# Patient Record
Sex: Female | Born: 1995 | Race: White | Hispanic: No | Marital: Married | Smoking: Former smoker
Health system: Southern US, Community
[De-identification: ages and names within clinical notes are randomized; demographics above are authoritative.]

## PROBLEM LIST (undated history)

## (undated) ENCOUNTER — Inpatient Hospital Stay (HOSPITAL_COMMUNITY): Payer: Self-pay

## (undated) DIAGNOSIS — T4145XA Adverse effect of unspecified anesthetic, initial encounter: Secondary | ICD-10-CM

## (undated) DIAGNOSIS — T8859XA Other complications of anesthesia, initial encounter: Secondary | ICD-10-CM

## (undated) DIAGNOSIS — Z789 Other specified health status: Secondary | ICD-10-CM

## (undated) HISTORY — PX: WISDOM TOOTH EXTRACTION: SHX21

---

## 2017-08-24 NOTE — L&D Delivery Note (Signed)
Delivery Note At 12:34 PM a viable female was delivered via Vaginal, Spontaneous OA   APGAR: 9,9  weight  pending .   Placenta status:spontaneously with 3 vessel cord , .  Cord:  with the following complications: none.  Cord pH: not obtained Anesthesia:  Epidural Episiotomy: None Lacerations:  2nd Suture Repair: 3.0 chromic Est. Blood Loss (mL):  300  Mom to postpartum.  Baby to Couplet care / Skin to Skin.  Nilam Quakenbush L 07/10/2018, 12:48 PM

## 2017-09-26 DIAGNOSIS — Z5321 Procedure and treatment not carried out due to patient leaving prior to being seen by health care provider: Secondary | ICD-10-CM | POA: Insufficient documentation

## 2017-09-26 DIAGNOSIS — M79604 Pain in right leg: Secondary | ICD-10-CM | POA: Insufficient documentation

## 2017-09-27 ENCOUNTER — Emergency Department (HOSPITAL_COMMUNITY)
Admission: EM | Admit: 2017-09-27 | Discharge: 2017-09-27 | Disposition: A | Discharge status: Home / Self Care | Payer: Self-pay | Attending: Emergency Medicine | Admitting: Emergency Medicine

## 2017-09-27 ENCOUNTER — Encounter (HOSPITAL_COMMUNITY): Payer: Self-pay

## 2017-09-27 ENCOUNTER — Other Ambulatory Visit: Payer: Self-pay

## 2017-09-27 NOTE — ED Notes (Signed)
Pt states they are not waiting

## 2017-09-27 NOTE — ED Triage Notes (Addendum)
Pt reports R upper leg pain that shoots up in to her hip and side. She denies any urinary issues. A&Ox4. Ambulatory. When asked about a pregnancy test, she states, "I don't really need one, although she mentioned it to registration."

## 2017-12-24 LAB — OB RESULTS CONSOLE HEPATITIS B SURFACE ANTIGEN: HEP B S AG: NEGATIVE

## 2017-12-24 LAB — OB RESULTS CONSOLE RUBELLA ANTIBODY, IGM: RUBELLA: IMMUNE

## 2017-12-24 LAB — OB RESULTS CONSOLE HIV ANTIBODY (ROUTINE TESTING): HIV: NONREACTIVE

## 2017-12-24 LAB — OB RESULTS CONSOLE GC/CHLAMYDIA
CHLAMYDIA, DNA PROBE: NEGATIVE
GC PROBE AMP, GENITAL: NEGATIVE

## 2017-12-24 LAB — OB RESULTS CONSOLE RPR: RPR: NONREACTIVE

## 2018-04-10 ENCOUNTER — Inpatient Hospital Stay (HOSPITAL_COMMUNITY)
Admission: AD | Admit: 2018-04-10 | Discharge: 2018-04-11 | Disposition: A | Discharge status: Home / Self Care | Payer: BLUE CROSS/BLUE SHIELD | Source: Ambulatory Visit | Attending: Obstetrics and Gynecology | Admitting: Obstetrics and Gynecology

## 2018-04-10 ENCOUNTER — Encounter (HOSPITAL_COMMUNITY): Payer: Self-pay

## 2018-04-10 DIAGNOSIS — R319 Hematuria, unspecified: Secondary | ICD-10-CM | POA: Diagnosis not present

## 2018-04-10 DIAGNOSIS — R3129 Other microscopic hematuria: Secondary | ICD-10-CM

## 2018-04-10 DIAGNOSIS — Z3A24 24 weeks gestation of pregnancy: Secondary | ICD-10-CM | POA: Diagnosis not present

## 2018-04-10 DIAGNOSIS — Z87891 Personal history of nicotine dependence: Secondary | ICD-10-CM | POA: Diagnosis not present

## 2018-04-10 DIAGNOSIS — O26892 Other specified pregnancy related conditions, second trimester: Secondary | ICD-10-CM | POA: Insufficient documentation

## 2018-04-10 DIAGNOSIS — R10A1 Flank pain, right side: Secondary | ICD-10-CM | POA: Diagnosis present

## 2018-04-10 DIAGNOSIS — R109 Unspecified abdominal pain: Secondary | ICD-10-CM | POA: Insufficient documentation

## 2018-04-10 NOTE — MAU Note (Addendum)
Right sided pain in her abdomen that radiates around to her back that started at 2000.  States it's constant but intermittently gets worse.  No VB/LOF.  +FM.  Has not taken anything for the pain.  No hx of any kidney issues-does get frequent UTIs.

## 2018-04-11 ENCOUNTER — Encounter (HOSPITAL_COMMUNITY): Payer: Self-pay

## 2018-04-11 ENCOUNTER — Inpatient Hospital Stay (HOSPITAL_COMMUNITY): Payer: BLUE CROSS/BLUE SHIELD

## 2018-04-11 DIAGNOSIS — Z3A24 24 weeks gestation of pregnancy: Secondary | ICD-10-CM

## 2018-04-11 DIAGNOSIS — O26892 Other specified pregnancy related conditions, second trimester: Secondary | ICD-10-CM

## 2018-04-11 DIAGNOSIS — R109 Unspecified abdominal pain: Secondary | ICD-10-CM

## 2018-04-11 DIAGNOSIS — R319 Hematuria, unspecified: Secondary | ICD-10-CM | POA: Diagnosis present

## 2018-04-11 DIAGNOSIS — R10A1 Flank pain, right side: Secondary | ICD-10-CM | POA: Diagnosis present

## 2018-04-11 LAB — URINALYSIS, ROUTINE W REFLEX MICROSCOPIC
BILIRUBIN URINE: NEGATIVE
GLUCOSE, UA: NEGATIVE mg/dL
Ketones, ur: NEGATIVE mg/dL
NITRITE: NEGATIVE
PROTEIN: 100 mg/dL — AB
Specific Gravity, Urine: 1.009 (ref 1.005–1.030)
WBC, UA: >50 WBC/hpf — ABNORMAL HIGH (ref 0–5)
pH: 7 (ref 5.0–8.0)

## 2018-04-11 MED ORDER — CYCLOBENZAPRINE HCL 10 MG PO TABS
10.0000 mg | ORAL_TABLET | Freq: Once | ORAL | Status: AC
  Administered 2018-04-11: 10 mg via ORAL
  Filled 2018-04-11: qty 2

## 2018-04-11 MED ORDER — OXYCODONE-ACETAMINOPHEN 5-325 MG PO TABS: 1.0000 | ORAL_TABLET | Freq: Four times a day (QID) | ORAL | 0 refills | Status: DC | PRN

## 2018-04-11 MED ORDER — OXYCODONE-ACETAMINOPHEN 5-325 MG PO TABS
1.0000 | ORAL_TABLET | Freq: Once | ORAL | Status: AC
  Administered 2018-04-11: 1 via ORAL
  Filled 2018-04-11: qty 1

## 2018-04-11 MED ORDER — TAMSULOSIN HCL 0.4 MG PO CAPS: 0.8000 mg | ORAL_CAPSULE | Freq: Every day | ORAL | 0 refills | Status: DC

## 2018-04-11 NOTE — Progress Notes (Addendum)
Disposition of admit was entered in error.  Raelyn Moraolitta Sharnise Blough, CNM

## 2018-04-11 NOTE — MAU Provider Note (Signed)
History     CSN: [id number]  Arrival date and time: 04/10/18 2321   First Provider Initiated Contact with Patient 04/10/18 2359      Chief Complaint  Patient presents with  . Abdominal Pain   HPI  Ms.  Kaylee Watson is a 22 y.o. year old 432P0010 female at 3077w3d weeks gestation who presents to MAU reporting RT sided flank pain that radiates to her abdomen and lower back since 2000 while sitting on the couch. She reports the pain is constant, but getting worse off and on. She denies UC's, VB or LOF. She hasn't taken anything for the pain. No h/o kidney problems in the past.   History reviewed. No pertinent past medical history.  History reviewed. No pertinent surgical history.  No family history on file.  Social History   Tobacco Use  . Smoking status: Former Games developermoker  . Smokeless tobacco: Never Used  Substance Use Topics  . Alcohol use: Not on file  . Drug use: Never    Allergies: No Known Allergies  Medications Prior to Admission  Medication Sig Dispense Refill Last Dose  . Prenatal Vit-Fe Fumarate-FA (PRENATAL MULTIVITAMIN) TABS tablet Take 1 tablet by mouth daily at 12 noon.       Review of Systems  Constitutional: Negative.   HENT: Negative.   Eyes: Negative.   Respiratory: Negative.   Cardiovascular: Negative.   Gastrointestinal: Positive for abdominal pain.  Endocrine: Negative.   Genitourinary: Positive for flank pain. Negative for dysuria, frequency, hematuria and urgency.  Musculoskeletal: Positive for back pain.  Skin: Negative.   Allergic/Immunologic: Negative.   Neurological: Negative.   Hematological: Negative.   Psychiatric/Behavioral: Negative.    Physical Exam   Blood pressure 126/60, pulse 98, temperature 97.8 F (36.6 C), resp. rate 17, height 5\' 6"  (1.676 m), weight 87.8 kg, SpO2 99 %.  Physical Exam  Nursing note and vitals reviewed. Constitutional: She is oriented to person, place, and time. She appears well-developed and  well-nourished.  HENT:  Head: Normocephalic and atraumatic.  Eyes: Pupils are equal, round, and reactive to light.  Neck: Normal range of motion.  Cardiovascular: Normal rate.  Respiratory: Effort normal.  GI: Soft. There is no CVA tenderness.  Genitourinary:  Genitourinary Comments: Pelvic deferred  Musculoskeletal: Normal range of motion.  Neurological: She is alert and oriented to person, place, and time. She has normal reflexes.  Skin: Skin is warm and dry.  Psychiatric: She has a normal mood and affect. Her behavior is normal. Judgment and thought content normal.    MAU Course  Procedures  MDM CCUA NST - FHR: 130 bpm / moderate variability / accels present / decels absent / TOCO: none Renal U/S  Results for orders placed or performed during the hospital encounter of 04/10/18 (from the past 24 hour(s))  Urinalysis, Routine w reflex microscopic     Status: Abnormal   Collection Time: 04/10/18 11:57 PM  Result Value Ref Range   Color, Urine YELLOW YELLOW   APPearance CLOUDY (A) CLEAR   Specific Gravity, Urine 1.009 1.005 - 1.030   pH 7.0 5.0 - 8.0   Glucose, UA NEGATIVE NEGATIVE mg/dL   Hgb urine dipstick MODERATE (A) NEGATIVE   Bilirubin Urine NEGATIVE NEGATIVE   Ketones, ur NEGATIVE NEGATIVE mg/dL   Protein, ur 409100 (A) NEGATIVE mg/dL   Nitrite NEGATIVE NEGATIVE   Leukocytes, UA LARGE (A) NEGATIVE   RBC / HPF >50 (H) 0 - 5 RBC/hpf   WBC, UA >50 (H) 0 -  5 WBC/hpf   Bacteria, UA RARE (A) NONE SEEN   Non Squamous Epithelial 6-10 (A) NONE SEEN    [place] Renal  Result Date: 04/11/2018 CLINICAL DATA:  Acute right flank pain with hematuria, [redacted] weeks pregnant EXAM: RENAL / URINARY TRACT ULTRASOUND COMPLETE COMPARISON:  None. FINDINGS: Right Kidney: Length: 12.2 cm.  Mild right hydronephrosis.  No focal abnormality. Left Kidney: Length: 11.6 cm. Echogenicity within normal limits. No mass or hydronephrosis visualized. Bladder: Appears normal for degree of bladder distention.  IMPRESSION: Mild right hydronephrosis. Electronically Signed   By: Jasmine PangKim  Fujinaga M.D.   On: 04/11/2018 02:30    *Consult with Dr. Elon SpannerLeger @ 0100 - notified of patient's complaints, assessments, lab results, recommended tx plan Flexeril for back pain and renal U/S, if normal U/S d/c home with Rx for Flexeril and Flomax  Assessment and Plan  Blood urine  - Advised that she could possibly have a kidney stone and will be prescribed Flomax to help pass any possible kidney stone she might have - Rx for Flomax 0.8 mg daily after breakfast - Information provided on hematuria   Acute right flank pain  - Rx for Percocet 5/325 mg 1-2 tablets every 6 hrs prn pain sent - Information provided on flank pain   - Discharge patient - Call [facility] to see if they want to see you sooner than your scheduled OB appt on 04/20/18 - Patient verbalized an understanding of the plan of care and agrees.     Kaylee Moraolitta Adira Limburg, MSN, CNM 04/11/2018, 12:30 AM

## 2018-05-07 ENCOUNTER — Inpatient Hospital Stay (HOSPITAL_COMMUNITY)
Admission: AD | Admit: 2018-05-07 | Discharge: 2018-05-08 | Disposition: A | Discharge status: Home / Self Care | Payer: BLUE CROSS/BLUE SHIELD | Source: Ambulatory Visit | Attending: Obstetrics and Gynecology | Admitting: Obstetrics and Gynecology

## 2018-05-07 DIAGNOSIS — Z87891 Personal history of nicotine dependence: Secondary | ICD-10-CM | POA: Diagnosis not present

## 2018-05-07 DIAGNOSIS — O99613 Diseases of the digestive system complicating pregnancy, third trimester: Secondary | ICD-10-CM | POA: Diagnosis not present

## 2018-05-07 DIAGNOSIS — K529 Noninfective gastroenteritis and colitis, unspecified: Secondary | ICD-10-CM | POA: Insufficient documentation

## 2018-05-07 DIAGNOSIS — N390 Urinary tract infection, site not specified: Secondary | ICD-10-CM

## 2018-05-07 DIAGNOSIS — Z3A28 28 weeks gestation of pregnancy: Secondary | ICD-10-CM | POA: Diagnosis not present

## 2018-05-07 DIAGNOSIS — O2343 Unspecified infection of urinary tract in pregnancy, third trimester: Secondary | ICD-10-CM | POA: Insufficient documentation

## 2018-05-07 DIAGNOSIS — K219 Gastro-esophageal reflux disease without esophagitis: Secondary | ICD-10-CM | POA: Insufficient documentation

## 2018-05-07 DIAGNOSIS — O26893 Other specified pregnancy related conditions, third trimester: Secondary | ICD-10-CM | POA: Diagnosis not present

## 2018-05-07 DIAGNOSIS — R112 Nausea with vomiting, unspecified: Secondary | ICD-10-CM | POA: Diagnosis present

## 2018-05-07 HISTORY — DX: Other complications of anesthesia, initial encounter: T88.59XA

## 2018-05-07 HISTORY — DX: Other specified health status: Z78.9

## 2018-05-07 HISTORY — DX: Adverse effect of unspecified anesthetic, initial encounter: T41.45XA

## 2018-05-07 LAB — URINALYSIS, ROUTINE W REFLEX MICROSCOPIC
Bilirubin Urine: NEGATIVE
GLUCOSE, UA: NEGATIVE mg/dL
Hgb urine dipstick: NEGATIVE
Ketones, ur: 80 mg/dL — AB
NITRITE: NEGATIVE
Protein, ur: 30 mg/dL — AB
Specific Gravity, Urine: 1.018 (ref 1.005–1.030)
Trans Epithel, UA: <1
WBC, UA: >50 WBC/hpf — ABNORMAL HIGH (ref 0–5)
pH: 6 (ref 5.0–8.0)

## 2018-05-07 NOTE — MAU Note (Signed)
N/V/D all day. Step-daughter sick as well. Denies vag bleeding or d/c. Hx previa-u/s for this Weds to ck if resolved.

## 2018-05-08 ENCOUNTER — Encounter (HOSPITAL_COMMUNITY): Payer: Self-pay | Admitting: *Deleted

## 2018-05-08 ENCOUNTER — Other Ambulatory Visit: Payer: Self-pay

## 2018-05-08 DIAGNOSIS — K529 Noninfective gastroenteritis and colitis, unspecified: Secondary | ICD-10-CM

## 2018-05-08 DIAGNOSIS — Z3A28 28 weeks gestation of pregnancy: Secondary | ICD-10-CM

## 2018-05-08 DIAGNOSIS — O26893 Other specified pregnancy related conditions, third trimester: Secondary | ICD-10-CM

## 2018-05-08 DIAGNOSIS — O99613 Diseases of the digestive system complicating pregnancy, third trimester: Secondary | ICD-10-CM | POA: Diagnosis not present

## 2018-05-08 LAB — COMPREHENSIVE METABOLIC PANEL
ALT: 10 U/L (ref 0–44)
ANION GAP: 11 (ref 5–15)
AST: 17 U/L (ref 15–41)
Albumin: 3 g/dL — ABNORMAL LOW (ref 3.5–5.0)
Alkaline Phosphatase: 84 U/L (ref 38–126)
BUN: 8 mg/dL (ref 6–20)
CHLORIDE: 102 mmol/L (ref 98–111)
CO2: 23 mmol/L (ref 22–32)
CREATININE: 0.61 mg/dL (ref 0.44–1.00)
Calcium: 8.8 mg/dL — ABNORMAL LOW (ref 8.9–10.3)
Glucose, Bld: 125 mg/dL — ABNORMAL HIGH (ref 70–99)
POTASSIUM: 3.6 mmol/L (ref 3.5–5.1)
SODIUM: 136 mmol/L (ref 135–145)
Total Bilirubin: 0.2 mg/dL — ABNORMAL LOW (ref 0.3–1.2)
Total Protein: 6.2 g/dL — ABNORMAL LOW (ref 6.5–8.1)

## 2018-05-08 MED ORDER — DEXTROSE 5 % IN LACTATED RINGERS IV BOLUS
1000.0000 mL | Freq: Once | INTRAVENOUS | Status: AC
  Administered 2018-05-08: 1000 mL via INTRAVENOUS

## 2018-05-08 MED ORDER — ONDANSETRON 8 MG PO TBDP: 8.0000 mg | ORAL_TABLET | Freq: Three times a day (TID) | ORAL | 0 refills | Status: DC | PRN

## 2018-05-08 MED ORDER — SIMETHICONE 80 MG PO CHEW
160.0000 mg | CHEWABLE_TABLET | Freq: Once | ORAL | Status: AC
Start: 2018-05-08 — End: 2018-05-08
  Administered 2018-05-08: 160 mg via ORAL
  Filled 2018-05-08: qty 2

## 2018-05-08 MED ORDER — FAMOTIDINE IN NACL 20-0.9 MG/50ML-% IV SOLN
20.0000 mg | Freq: Once | INTRAVENOUS | Status: AC
  Administered 2018-05-08: 20 mg via INTRAVENOUS
  Filled 2018-05-08: qty 50

## 2018-05-08 MED ORDER — GI COCKTAIL ~~LOC~~
30.0000 mL | Freq: Once | ORAL | Status: AC
  Administered 2018-05-08: 30 mL via ORAL
  Filled 2018-05-08: qty 30

## 2018-05-08 MED ORDER — SODIUM CHLORIDE 0.9 % IV SOLN
8.0000 mg | Freq: Once | INTRAVENOUS | Status: AC
  Administered 2018-05-08: 8 mg via INTRAVENOUS
  Filled 2018-05-08: qty 4

## 2018-05-08 MED ORDER — M.V.I. ADULT IV INJ
Freq: Once | INTRAVENOUS | Status: AC
  Administered 2018-05-08: 02:00:00 via INTRAVENOUS
  Filled 2018-05-08: qty 1000

## 2018-05-08 MED ORDER — CEPHALEXIN 500 MG PO CAPS: 500.0000 mg | ORAL_CAPSULE | Freq: Four times a day (QID) | ORAL | 0 refills | Status: DC

## 2018-05-08 NOTE — MAU Provider Note (Signed)
History     CSN: [id number]  Arrival date and time: 05/07/18 2301   None     Chief Complaint  Patient presents with  . Emesis During Pregnancy  . Diarrhea   HPI   Ms.Kaylee Watson is a 22 y.o. female G2P0010 @ 9w2dhere with nausea, vomiting, diarrhea. The symptoms started yesterday. Says her significant other's child has had the same symptoms at home. Says she has had vomiting one time and has had multiple episodes of diarrhea.  No fever. + fetal movement. Has not taken any medications for the symptoms.   OB History    Gravida  2   Para      Term      Preterm      AB  1   Living        SAB  1   TAB      Ectopic      Multiple      Live Births              Past Medical History:  Diagnosis Date  . Complication of anesthesia   . Medical history non-contributory     Past Surgical History:  Procedure Laterality Date  . WISDOM TOOTH EXTRACTION      Family History  Problem Relation Age of Onset  . ADD / ADHD Neg Hx   . Alcohol abuse Neg Hx   . Arthritis Neg Hx   . Anxiety disorder Neg Hx   . Asthma Neg Hx   . Birth defects Neg Hx   . Cancer Neg Hx   . COPD Neg Hx   . Depression Neg Hx   . Diabetes Neg Hx   . Drug abuse Neg Hx   . Early death Neg Hx   . Heart disease Neg Hx   . Hearing loss Neg Hx   . Hyperlipidemia Neg Hx   . Hypertension Neg Hx   . Intellectual disability Neg Hx   . Kidney disease Neg Hx   . Learning disabilities Neg Hx   . Miscarriages / Stillbirths Neg Hx   . Obesity Neg Hx   . Stroke Neg Hx   . Varicose Veins Neg Hx   . Vision loss Neg Hx     Social History   Tobacco Use  . Smoking status: Former SResearch scientist (life sciences) . Smokeless tobacco: Never Used  Substance Use Topics  . Alcohol use: Not on file  . Drug use: Never    Allergies: No Known Allergies  Medications Prior to Admission  Medication Sig Dispense Refill Last Dose  . calcium carbonate (TUMS - DOSED IN MG ELEMENTAL CALCIUM) 500 MG chewable tablet Chew  1 tablet by mouth daily.   05/08/2018 at Unknown time  . Prenatal Vit-Fe Fumarate-FA (PRENATAL MULTIVITAMIN) TABS tablet Take 1 tablet by mouth daily at 12 noon.   05/08/2018 at Unknown time  . oxyCODONE-acetaminophen (PERCOCET/ROXICET) 5-325 MG tablet Take 1-2 tablets by mouth every 6 (six) hours as needed for severe pain. 30 tablet 0   . tamsulosin (FLOMAX) 0.4 MG CAPS capsule Take 2 capsules (0.8 mg total) by mouth daily after breakfast. 30 capsule 0    Results for orders placed or performed during the hospital encounter of 05/07/18 (from the past 48 hour(s))  Urinalysis, Routine w reflex microscopic     Status: Abnormal   Collection Time: 05/07/18 11:23 PM  Result Value Ref Range   Color, Urine YELLOW YELLOW   APPearance HAZY (A) CLEAR  Specific Gravity, Urine 1.018 1.005 - 1.030   pH 6.0 5.0 - 8.0   Glucose, UA NEGATIVE NEGATIVE mg/dL   Hgb urine dipstick NEGATIVE NEGATIVE   Bilirubin Urine NEGATIVE NEGATIVE   Ketones, ur 80 (A) NEGATIVE mg/dL   Protein, ur 30 (A) NEGATIVE mg/dL   Nitrite NEGATIVE NEGATIVE   Leukocytes, UA MODERATE (A) NEGATIVE   RBC / HPF 0-5 0 - 5 RBC/hpf   WBC, UA >50 (H) 0 - 5 WBC/hpf   Bacteria, UA FEW (A) NONE SEEN   Squamous Epithelial / LPF 0-5 0 - 5   Trans Epithel, UA <1    Mucus PRESENT     Comment: Performed at [facility], [address] [place], [place] [address]  [facility] Urine Culture     Status: None   Collection Time: 05/07/18 11:31 PM  Result Value Ref Range   Specimen Description      OB CLEAN CATCH Performed at [facility], [address] [place], [place] [address]    Special Requests      Normal Performed at [facility], [address], [place], [place] [address]    Culture      NO GROWTH Performed at [facility], [place] [address], [place], [place] [address]    Report Status 05/09/2018 FINAL   Comprehensive metabolic panel     Status: Abnormal   Collection Time: 05/08/18  1:18 AM  Result Value Ref Range   Sodium  136 135 - 145 mmol/L   Potassium 3.6 3.5 - 5.1 mmol/L   Chloride 102 98 - 111 mmol/L   CO2 23 22 - 32 mmol/L   Glucose, Bld 125 (H) 70 - 99 mg/dL   BUN 8 6 - 20 mg/dL   Creatinine, Ser 0.61 0.44 - 1.00 mg/dL   Calcium 8.8 (L) 8.9 - 10.3 mg/dL   Total Protein 6.2 (L) 6.5 - 8.1 g/dL   Albumin 3.0 (L) 3.5 - 5.0 g/dL   AST 17 15 - 41 U/L   ALT 10 0 - 44 U/L   Alkaline Phosphatase 84 38 - 126 U/L   Total Bilirubin 0.2 (L) 0.3 - 1.2 mg/dL   GFR calc non Af Amer >60 >60 mL/min   GFR calc Af Amer >60 >60 mL/min    Comment: (NOTE) The eGFR has been calculated using the CKD EPI equation. This calculation has not been validated in all clinical situations. eGFR's persistently <60 mL/min signify possible Chronic Kidney Disease.    Anion gap 11 5 - 15    Comment: Performed at [facility], [address], [place],  [address]   Review of Systems  Gastrointestinal: Positive for diarrhea, nausea and vomiting. Negative for abdominal pain.  Genitourinary: Negative for dysuria.   Physical Exam   Blood pressure (!) 116/57, pulse 93, temperature 98.4 F (36.9 C), resp. rate 18, height 5' 5"  (1.651 m), weight 87.1 kg.  Physical Exam  Constitutional: She is oriented to person, place, and time. She appears well-developed and well-nourished.  Non-toxic appearance. She has a sickly appearance. She appears ill. No distress.  GI: Soft. Normal appearance. There is no CVA tenderness.  Musculoskeletal: Normal range of motion.  Neurological: She is alert and oriented to person, place, and time.  Skin: Skin is warm. She is not diaphoretic.  Psychiatric: Her behavior is normal.    Fetal Tracing: Baseline: 130 bpm Variability: moderate  Accelerations: 15x15 Decelerations: None Toco: None  MAU Course  Procedures  MDM  Urine with >  80 ketones  D5 LR bolus x1 MVI  x1 Pepcid 20 mg IV X 1 Zofran 8 mg IV X 1 Patient feeling a lot of gas, Gerd like symptoms in her chest and upper back. Ekg  ordered and NSR. Patient was given simethicone and Gi cocktail, feeling much better. No vomiting in MAU.  Discussed labs, HPI, fetal tracing and MAU course with Dr. Helane Rima, ok for DC home.   Assessment and Plan   A:  1. Gastroenteritis   2. Acute UTI   3. Gastroesophageal reflux disease, esophagitis presence not specified     P:  Discharge home in stable condition Small, frequent meals Rx: Zofran, Keflex  BRAT diet Increase oral fluid intake Return to MAU if symptoms worsen Hand hygiene discussed.   Lezlie Lye, NP 05/09/2018 4:43 PM

## 2018-05-08 NOTE — Discharge Instructions (Signed)
Asymptomatic Bacteriuria Asymptomatic bacteriuria is the presence of a large number of bacteria in the urine without the usual symptoms of burning or frequent urination. What are the causes? This condition is caused by an increase in bacteria in the urine. This increase can be caused by:  Bacteria entering the urinary tract, such as during sex.  A blockage in the urinary tract, such as from kidney stones or a tumor.  Bladder problems that prevent the bladder from emptying.  What increases the risk? You are more likely to develop this condition if:  You have diabetes mellitus.  You are an elderly adult, especially if you are also in a long-term care facility.  You are pregnant and in the first trimester.  You have kidney stones.  You are female.  You have had a kidney transplant.  You have a leaky kidney tube valve (reflux).  You had a urinary catheter for a long period of time.  What are the signs or symptoms? There are no symptoms of this condition. How is this diagnosed? This condition is diagnosed with a urine test. Because this condition does not cause symptoms, it is usually diagnosed when a urine sample is taken to treat or diagnose another condition, such as pregnancy or kidney problems. Most women who are in their first trimester of pregnancy are screened for asymptomatic bacteriuria. How is this treated? Usually, treatment is not needed for this condition. Treating the condition can lead to other problems, such as a yeast infection or the growth of bacteria that do not respond to treatment (antibiotic-resistant bacteria). Some people, such as pregnant women and people with kidney transplants, do need treatment with antibiotic medicines to prevent kidney infection (pyelonephritis). In pregnant women, kidney infection can lead to premature labor, fetal growth restriction, or newborn death. Follow these instructions at home: Medicines  Take over-the-counter and  prescription medicines only as told by your health care provider.  If you were prescribed an antibiotic medicine, take it as told by your health care provider. Do not stop taking the antibiotic even if you start to feel better. General instructions  Monitor your condition for any changes.  Drink enough fluid to keep your urine clear or pale yellow.  Go to the bathroom more often to keep your bladder empty.  If you are female, keep the area around your vagina and rectum clean. Wipe yourself from front to back after urinating.  Keep all follow-up visits as told by your health care provider. This is important. Contact a health care provider if:  You notice any new symptoms, such as back pain or burning while urinating. Get help right away if:  You develop signs of an infection such as: ? A burning sensation when you urinate. ? Have pain when you urinate. ? Develop an intense need to urinate. ? Urinating more frequently. ? Back pain or pelvic pain. ? Fever or chills.  You have blood in your urine.  Your urine becomes discolored or cloudy.  Your urine smells bad.  You have severe pain that cannot be controlled with medicine. Summary  Asymptomatic bacteriuria is the presence of a large number of bacteria in the urine without the usual symptoms of burning or frequent urination.  Usually, treatment is not needed for this condition. Treating the condition can lead to other problems, such as too much yeast and the growth of antibiotic-resistant bacteria.  Some people, such as pregnant women and people with kidney transplants, do need treatment with antibiotic medicines to prevent   kidney infection (pyelonephritis).  If you were prescribed an antibiotic medicine, take it as told by your health care provider. Do not stop taking the antibiotic even if you start to feel better. This information is not intended to replace advice given to you by your health care provider. Make sure you  discuss any questions you have with your health care provider. Document Released: 08/10/2005 Document Revised: 08/04/2016 Document Reviewed: 08/04/2016 Elsevier Interactive Patient Education  2017 Elsevier Inc.  

## 2018-05-09 LAB — CULTURE, OB URINE
CULTURE: NO GROWTH
Special Requests: NORMAL

## 2018-06-05 ENCOUNTER — Inpatient Hospital Stay (HOSPITAL_COMMUNITY)
Admission: AD | Admit: 2018-06-05 | Discharge: 2018-06-05 | Disposition: A | Discharge status: Home / Self Care | Payer: BLUE CROSS/BLUE SHIELD | Source: Ambulatory Visit | Attending: Obstetrics and Gynecology | Admitting: Obstetrics and Gynecology

## 2018-06-05 ENCOUNTER — Encounter (HOSPITAL_COMMUNITY): Payer: Self-pay

## 2018-06-05 DIAGNOSIS — O26893 Other specified pregnancy related conditions, third trimester: Secondary | ICD-10-CM | POA: Insufficient documentation

## 2018-06-05 DIAGNOSIS — J029 Acute pharyngitis, unspecified: Secondary | ICD-10-CM | POA: Diagnosis present

## 2018-06-05 DIAGNOSIS — Z3A32 32 weeks gestation of pregnancy: Secondary | ICD-10-CM | POA: Diagnosis not present

## 2018-06-05 DIAGNOSIS — Z87891 Personal history of nicotine dependence: Secondary | ICD-10-CM | POA: Diagnosis not present

## 2018-06-05 DIAGNOSIS — R030 Elevated blood-pressure reading, without diagnosis of hypertension: Secondary | ICD-10-CM | POA: Insufficient documentation

## 2018-06-05 DIAGNOSIS — Z20818 Contact with and (suspected) exposure to other bacterial communicable diseases: Secondary | ICD-10-CM | POA: Diagnosis not present

## 2018-06-05 DIAGNOSIS — Z3493 Encounter for supervision of normal pregnancy, unspecified, third trimester: Secondary | ICD-10-CM

## 2018-06-05 LAB — URINALYSIS, ROUTINE W REFLEX MICROSCOPIC
Bilirubin Urine: NEGATIVE
GLUCOSE, UA: NEGATIVE mg/dL
Hgb urine dipstick: NEGATIVE
Ketones, ur: NEGATIVE mg/dL
Leukocytes, UA: NEGATIVE
Nitrite: NEGATIVE
Protein, ur: NEGATIVE mg/dL
Specific Gravity, Urine: 1.013 (ref 1.005–1.030)
pH: 7 (ref 5.0–8.0)

## 2018-06-05 LAB — GROUP A STREP BY PCR: GROUP A STREP BY PCR: NOT DETECTED

## 2018-06-05 LAB — COMPREHENSIVE METABOLIC PANEL
ALK PHOS: 114 U/L (ref 38–126)
ALT: 10 U/L (ref 0–44)
AST: 15 U/L (ref 15–41)
Albumin: 2.9 g/dL — ABNORMAL LOW (ref 3.5–5.0)
Anion gap: 10 (ref 5–15)
BUN: 6 mg/dL (ref 6–20)
CALCIUM: 8.9 mg/dL (ref 8.9–10.3)
CO2: 21 mmol/L — AB (ref 22–32)
CREATININE: 0.59 mg/dL (ref 0.44–1.00)
Chloride: 106 mmol/L (ref 98–111)
Glucose, Bld: 90 mg/dL (ref 70–99)
Potassium: 3.9 mmol/L (ref 3.5–5.1)
SODIUM: 137 mmol/L (ref 135–145)
Total Bilirubin: 0.4 mg/dL (ref 0.3–1.2)
Total Protein: 6.7 g/dL (ref 6.5–8.1)

## 2018-06-05 LAB — CBC
HEMATOCRIT: 34.9 % — AB (ref 36.0–46.0)
HEMOGLOBIN: 11.4 g/dL — AB (ref 12.0–15.0)
MCH: 28.8 pg (ref 26.0–34.0)
MCHC: 32.7 g/dL (ref 30.0–36.0)
MCV: 88.1 fL (ref 80.0–100.0)
Platelets: 301 10*3/uL (ref 150–400)
RBC: 3.96 MIL/uL (ref 3.87–5.11)
RDW: 14.8 % (ref 11.5–15.5)
WBC: 16.1 10*3/uL — ABNORMAL HIGH (ref 4.0–10.5)
nRBC: 0 % (ref 0.0–0.2)

## 2018-06-05 LAB — PROTEIN / CREATININE RATIO, URINE
Creatinine, Urine: 94 mg/dL
Protein Creatinine Ratio: 0.14 mg/mg{Cre} (ref 0.00–0.15)
TOTAL PROTEIN, URINE: 13 mg/dL

## 2018-06-05 NOTE — Discharge Instructions (Signed)

## 2018-06-05 NOTE — MAU Note (Signed)
Sore throat since Wednesday, worse today, did not try any medicine at home. Feels like it is swollen  No cough or runny nose  +FM

## 2018-06-05 NOTE — MAU Provider Note (Signed)
History     CSN: [id number]  Arrival date and time: 06/05/18 1825   First Provider Initiated Contact with Patient 06/05/18 2106      Chief Complaint  Patient presents with  . Sore Throat   HPI  Kaylee Watson is a 22 y.o. G2P0010 at [redacted]w[redacted]d who presents to MAU with chief complaint of sort throat and pain with swallowing, new onset today at approximately 6pm. Denies vaginal bleeding, leaking of fluid, decreased fetal movement, fever, falls, or recent illness.    Patient's sister was diagnosed with and treated for Strep throat earlier this week.   OB History    Gravida  2   Para      Term      Preterm      AB  1   Living        SAB  1   TAB      Ectopic      Multiple      Live Births              Past Medical History:  Diagnosis Date  . Complication of anesthesia   . Medical history non-contributory     Past Surgical History:  Procedure Laterality Date  . WISDOM TOOTH EXTRACTION      Family History  Problem Relation Age of Onset  . ADD / ADHD Neg Hx   . Alcohol abuse Neg Hx   . Arthritis Neg Hx   . Anxiety disorder Neg Hx   . Asthma Neg Hx   . Birth defects Neg Hx   . Cancer Neg Hx   . COPD Neg Hx   . Depression Neg Hx   . Diabetes Neg Hx   . Drug abuse Neg Hx   . Early death Neg Hx   . Heart disease Neg Hx   . Hearing loss Neg Hx   . Hyperlipidemia Neg Hx   . Hypertension Neg Hx   . Intellectual disability Neg Hx   . Kidney disease Neg Hx   . Learning disabilities Neg Hx   . Miscarriages / Stillbirths Neg Hx   . Obesity Neg Hx   . Stroke Neg Hx   . Varicose Veins Neg Hx   . Vision loss Neg Hx     Social History   Tobacco Use  . Smoking status: Former Games developer  . Smokeless tobacco: Never Used  Substance Use Topics  . Alcohol use: Not Currently  . Drug use: Never    Allergies: No Known Allergies  No medications prior to admission.    Review of Systems  Constitutional: Negative for appetite change, chills, fatigue  and fever.  HENT: Positive for sore throat. Negative for congestion, sinus pressure, sinus pain, sneezing, trouble swallowing and voice change.   Respiratory: Negative for cough, chest tightness and shortness of breath.   Cardiovascular: Negative for chest pain.  Gastrointestinal: Negative for abdominal pain, nausea and vomiting.  All other systems reviewed and are negative.  Physical Exam   Blood pressure 124/75, pulse 99, temperature 97.9 F (36.6 C), temperature source Oral, resp. rate 18, weight 92.9 kg.  Physical Exam  Nursing note and vitals reviewed. Constitutional: She is oriented to person, place, and time. She appears well-developed and well-nourished.  HENT:  Nose: Nose normal.  Mouth/Throat: Uvula is midline, oropharynx is clear and moist and mucous membranes are normal. No oropharyngeal exudate or tonsillar abscesses.  Neck: Trachea normal.  GI:  Gravid  Genitourinary:  Genitourinary Comments: No assessed  based on chief complaint  Lymphadenopathy:       Head (right side): No submental, no tonsillar and no posterior auricular adenopathy present.       Head (left side): No submental, no tonsillar and no posterior auricular adenopathy present.    She has no cervical adenopathy.       Right: No supraclavicular adenopathy present.       Left: No supraclavicular adenopathy present.  Neurological: She is alert and oriented to person, place, and time.  Skin: Skin is warm and dry.  Psychiatric: She has a normal mood and affect. Her behavior is normal. Judgment and thought content normal.    MAU Course  Procedures  MDM  Reactive fetal tracing: baseline 125, moderate variability, positive accelerations, no decelerations Toco: quiet  Patient Vitals for the past 24 hrs:  BP Temp Temp src Pulse Resp Weight  06/05/18 2313 124/75 - - - - -  06/05/18 2300 124/75 - - 99 - -  06/05/18 2245 126/76 - - (!) 102 - -  06/05/18 2238 124/73 - - (!) 109 - -  06/05/18 2201 (!) 147/75  - - (!) 107 - -  06/05/18 2156 (!) 144/71 - - (!) 104 - -  06/05/18 2154 (!) 144/71 - - - - -  06/05/18 1846 (!) 87/71 97.9 F (36.6 C) Oral - 18 92.9 kg    Results for orders placed or performed during the hospital encounter of 06/05/18 (from the past 24 hour(s))  Group A Strep by PCR     Status: None   Collection Time: 06/05/18  9:13 PM  Result Value Ref Range   Group A Strep by PCR NOT DETECTED NOT DETECTED  Urinalysis, Routine w reflex microscopic     Status: None   Collection Time: 06/05/18  9:25 PM  Result Value Ref Range   Color, Urine YELLOW YELLOW   APPearance CLEAR CLEAR   Specific Gravity, Urine 1.013 1.005 - 1.030   pH 7.0 5.0 - 8.0   Glucose, UA NEGATIVE NEGATIVE mg/dL   Hgb urine dipstick NEGATIVE NEGATIVE   Bilirubin Urine NEGATIVE NEGATIVE   Ketones, ur NEGATIVE NEGATIVE mg/dL   Protein, ur NEGATIVE NEGATIVE mg/dL   Nitrite NEGATIVE NEGATIVE   Leukocytes, UA NEGATIVE NEGATIVE  Protein / creatinine ratio, urine     Status: None   Collection Time: 06/05/18  9:25 PM  Result Value Ref Range   Creatinine, Urine 94.00 mg/dL   Total Protein, Urine 13 mg/dL   Protein Creatinine Ratio 0.14 0.00 - 0.15 mg/mg[Cre]  CBC     Status: Abnormal   Collection Time: 06/05/18 10:19 PM  Result Value Ref Range   WBC 16.1 (H) 4.0 - 10.5 K/uL   RBC 3.96 3.87 - 5.11 MIL/uL   Hemoglobin 11.4 (L) 12.0 - 15.0 g/dL   HCT 09.8 (L) 11.9 - 14.7 %   MCV 88.1 80.0 - 100.0 fL   MCH 28.8 26.0 - 34.0 pg   MCHC 32.7 30.0 - 36.0 g/dL   RDW 82.9 56.2 - 13.0 %   Platelets 301 150 - 400 K/uL   nRBC 0.0 0.0 - 0.2 %  Comprehensive metabolic panel     Status: Abnormal   Collection Time: 06/05/18 10:19 PM  Result Value Ref Range   Sodium 137 135 - 145 mmol/L   Potassium 3.9 3.5 - 5.1 mmol/L   Chloride 106 98 - 111 mmol/L   CO2 21 (L) 22 - 32 mmol/L   Glucose, Bld 90 70 -  99 mg/dL   BUN 6 6 - 20 mg/dL   Creatinine, Ser 1.61 0.44 - 1.00 mg/dL   Calcium 8.9 8.9 - 09.6 mg/dL   Total Protein 6.7  6.5 - 8.1 g/dL   Albumin 2.9 (L) 3.5 - 5.0 g/dL   AST 15 15 - 41 U/L   ALT 10 0 - 44 U/L   Alkaline Phosphatase 114 38 - 126 U/L   Total Bilirubin 0.4 0.3 - 1.2 mg/dL   GFR calc non Af Amer >60 >60 mL/min   GFR calc Af Amer >60 >60 mL/min   Anion gap 10 5 - 15     Assessment and Plan  --22 y.o. G2P0010 at [redacted]w[redacted]d  --Negative Strep A swab --Elevated blood pressure x 3 in MAU, Preeclampsia labs normal --Reactive fetal tracing --Discharge home in stable condition  Calvert Cantor, CNM 06/05/18, 2130

## 2018-06-10 ENCOUNTER — Encounter (HOSPITAL_COMMUNITY): Payer: Self-pay | Admitting: *Deleted

## 2018-06-10 ENCOUNTER — Inpatient Hospital Stay (HOSPITAL_COMMUNITY)
Admission: AD | Admit: 2018-06-10 | Discharge: 2018-06-10 | Disposition: A | Discharge status: Home / Self Care | Payer: BLUE CROSS/BLUE SHIELD | Source: Ambulatory Visit | Attending: Obstetrics and Gynecology | Admitting: Obstetrics and Gynecology

## 2018-06-10 DIAGNOSIS — Z87891 Personal history of nicotine dependence: Secondary | ICD-10-CM | POA: Diagnosis not present

## 2018-06-10 DIAGNOSIS — O26893 Other specified pregnancy related conditions, third trimester: Secondary | ICD-10-CM | POA: Diagnosis not present

## 2018-06-10 DIAGNOSIS — Z3A33 33 weeks gestation of pregnancy: Secondary | ICD-10-CM | POA: Insufficient documentation

## 2018-06-10 DIAGNOSIS — R109 Unspecified abdominal pain: Secondary | ICD-10-CM | POA: Insufficient documentation

## 2018-06-10 DIAGNOSIS — Z3689 Encounter for other specified antenatal screening: Secondary | ICD-10-CM

## 2018-06-10 DIAGNOSIS — R51 Headache: Secondary | ICD-10-CM | POA: Diagnosis present

## 2018-06-10 LAB — COMPREHENSIVE METABOLIC PANEL
ALBUMIN: 2.9 g/dL — AB (ref 3.5–5.0)
ALT: 10 U/L (ref 0–44)
ANION GAP: 10 (ref 5–15)
AST: 16 U/L (ref 15–41)
Alkaline Phosphatase: 118 U/L (ref 38–126)
BUN: 6 mg/dL (ref 6–20)
CALCIUM: 9 mg/dL (ref 8.9–10.3)
CO2: 20 mmol/L — ABNORMAL LOW (ref 22–32)
CREATININE: 0.48 mg/dL (ref 0.44–1.00)
Chloride: 104 mmol/L (ref 98–111)
GFR calc non Af Amer: >60 mL/min (ref >60)
Glucose, Bld: 87 mg/dL (ref 70–99)
Potassium: 3.9 mmol/L (ref 3.5–5.1)
SODIUM: 134 mmol/L — AB (ref 135–145)
TOTAL PROTEIN: 6 g/dL — AB (ref 6.5–8.1)
Total Bilirubin: 0.4 mg/dL (ref 0.3–1.2)

## 2018-06-10 LAB — URINALYSIS, ROUTINE W REFLEX MICROSCOPIC
Bilirubin Urine: NEGATIVE
Glucose, UA: 50 mg/dL — AB
Hgb urine dipstick: NEGATIVE
KETONES UR: NEGATIVE mg/dL
Nitrite: NEGATIVE
PROTEIN: NEGATIVE mg/dL
Specific Gravity, Urine: 1.009 (ref 1.005–1.030)
pH: 7 (ref 5.0–8.0)

## 2018-06-10 LAB — CBC
HCT: 33.6 % — ABNORMAL LOW (ref 36.0–46.0)
Hemoglobin: 11.1 g/dL — ABNORMAL LOW (ref 12.0–15.0)
MCH: 28.8 pg (ref 26.0–34.0)
MCHC: 33 g/dL (ref 30.0–36.0)
MCV: 87.3 fL (ref 80.0–100.0)
NRBC: 0 % (ref 0.0–0.2)
PLATELETS: 289 10*3/uL (ref 150–400)
RBC: 3.85 MIL/uL — AB (ref 3.87–5.11)
RDW: 14.9 % (ref 11.5–15.5)
WBC: 13.4 10*3/uL — AB (ref 4.0–10.5)

## 2018-06-10 LAB — PROTEIN / CREATININE RATIO, URINE
Creatinine, Urine: 45 mg/dL
PROTEIN CREATININE RATIO: 0.13 mg/mg{creat} (ref 0.00–0.15)
TOTAL PROTEIN, URINE: 6 mg/dL

## 2018-06-10 MED ORDER — BUTALBITAL-APAP-CAFFEINE 50-325-40 MG PO TABS
2.0000 | ORAL_TABLET | Freq: Four times a day (QID) | ORAL | Status: DC | PRN
  Filled 2018-06-10: qty 2

## 2018-06-10 NOTE — Discharge Instructions (Signed)

## 2018-06-10 NOTE — MAU Note (Signed)
Presents with c/o H/A unrelived with Tylenol and visual disturbances (floaters).  Reports BP elevated last week during MAU visit.   Denies VB or LOF.  Reports +FM.

## 2018-06-10 NOTE — MAU Provider Note (Addendum)
History     CSN: [id number]  Arrival date and time: 06/10/18 1721   First Provider Initiated Contact with Patient 06/10/18 1756      Chief Complaint  Patient presents with  . Headache   G2P1001 @33 .0 wks here with HA. HA started today around 11 am. Located frontal and parietal. Associated sx are seeing floaters. Denies RUQ pain, SOB, and CP. Reports good FM. No VB, LOF, or ctx. Her pregnancy is complicated by low lying placenta that resolved, and transient HTN per pt.  OB History    Gravida  2   Para      Term      Preterm      AB  1   Living        SAB  1   TAB      Ectopic      Multiple      Live Births              Past Medical History:  Diagnosis Date  . Complication of anesthesia   . Medical history non-contributory     Past Surgical History:  Procedure Laterality Date  . WISDOM TOOTH EXTRACTION      Family History  Problem Relation Age of Onset  . Healthy Mother   . Diabetes Father   . Hypertension Father   . ADD / ADHD Neg Hx   . Alcohol abuse Neg Hx   . Arthritis Neg Hx   . Anxiety disorder Neg Hx   . Asthma Neg Hx   . Birth defects Neg Hx   . Cancer Neg Hx   . COPD Neg Hx   . Depression Neg Hx   . Drug abuse Neg Hx   . Early death Neg Hx   . Heart disease Neg Hx   . Hearing loss Neg Hx   . Hyperlipidemia Neg Hx   . Intellectual disability Neg Hx   . Kidney disease Neg Hx   . Learning disabilities Neg Hx   . Miscarriages / Stillbirths Neg Hx   . Obesity Neg Hx   . Stroke Neg Hx   . Varicose Veins Neg Hx   . Vision loss Neg Hx     Social History   Tobacco Use  . Smoking status: Former Games developer  . Smokeless tobacco: Never Used  Substance Use Topics  . Alcohol use: Not Currently  . Drug use: Never    Allergies: No Known Allergies  Medications Prior to Admission  Medication Sig Dispense Refill Last Dose  . calcium carbonate (TUMS - DOSED IN MG ELEMENTAL CALCIUM) 500 MG chewable tablet Chew 1 tablet by mouth daily.    Past Week at Unknown time  . cephALEXin (KEFLEX) 500 MG capsule Take 1 capsule (500 mg total) by mouth 4 (four) times daily. 28 capsule 0 Past Month at Unknown time  . ondansetron (ZOFRAN ODT) 8 MG disintegrating tablet Take 1 tablet (8 mg total) by mouth every 8 (eight) hours as needed for nausea or vomiting. 20 tablet 0 unknown  . Prenatal Vit-Fe Fumarate-FA (PRENATAL MULTIVITAMIN) TABS tablet Take 1 tablet by mouth daily at 12 noon.   06/05/2018 at Unknown time  . tamsulosin (FLOMAX) 0.4 MG CAPS capsule Take 2 capsules (0.8 mg total) by mouth daily after breakfast. 30 capsule 0 Unknown at Unknown time    Review of Systems  Eyes: Positive for visual disturbance.  Respiratory: Negative for shortness of breath.   Cardiovascular: Negative for chest pain.  Gastrointestinal: Negative  for abdominal pain.  Genitourinary: Negative for vaginal bleeding and vaginal discharge.  Neurological: Positive for headaches.   Physical Exam   Blood pressure 135/76, pulse (!) 108, temperature 97.8 F (36.6 C), temperature source Oral, resp. rate 18, SpO2 98 %. Patient Vitals for the past 24 hrs:  BP Temp Temp src Pulse Resp SpO2  06/10/18 1846 121/67 - - 92 - -  06/10/18 1831 121/70 - - 96 - -  06/10/18 1816 124/65 - - (!) 101 - -  06/10/18 1813 131/75 - - (!) 101 - -  06/10/18 1746 135/76 97.8 F (36.6 C) Oral (!) 108 18 98 %   Physical Exam  Constitutional: She is oriented to person, place, and time. She appears well-developed and well-nourished. No distress.  HENT:  Head: Normocephalic and atraumatic.  Eyes: Pupils are equal, round, and reactive to light.  Neck: Normal range of motion.  Cardiovascular: Normal rate.  Respiratory: Effort normal. No respiratory distress.  Musculoskeletal: Normal range of motion. She exhibits no edema.  Neurological: She is alert and oriented to person, place, and time. She has normal reflexes. No cranial nerve deficit.  Skin: Skin is warm and dry.  Psychiatric:  She has a normal mood and affect.  EFM: 145 bpm, mod variability, + accels, no decels Toco: irritability  Results for orders placed or performed during the hospital encounter of 06/10/18 (from the past 24 hour(s))  Urinalysis, Routine w reflex microscopic     Status: Abnormal   Collection Time: 06/10/18  5:37 PM  Result Value Ref Range   Color, Urine YELLOW YELLOW   APPearance HAZY (A) CLEAR   Specific Gravity, Urine 1.009 1.005 - 1.030   pH 7.0 5.0 - 8.0   Glucose, UA 50 (A) NEGATIVE mg/dL   Hgb urine dipstick NEGATIVE NEGATIVE   Bilirubin Urine NEGATIVE NEGATIVE   Ketones, ur NEGATIVE NEGATIVE mg/dL   Protein, ur NEGATIVE NEGATIVE mg/dL   Nitrite NEGATIVE NEGATIVE   Leukocytes, UA TRACE (A) NEGATIVE   RBC / HPF 0-5 0 - 5 RBC/hpf   WBC, UA 6-10 0 - 5 WBC/hpf   Bacteria, UA MANY (A) NONE SEEN   Squamous Epithelial / LPF 0-5 0 - 5   Mucus PRESENT   CBC     Status: Abnormal   Collection Time: 06/10/18  6:02 PM  Result Value Ref Range   WBC 13.4 (H) 4.0 - 10.5 K/uL   RBC 3.85 (L) 3.87 - 5.11 MIL/uL   Hemoglobin 11.1 (L) 12.0 - 15.0 g/dL   HCT 16.1 (L) 09.6 - 04.5 %   MCV 87.3 80.0 - 100.0 fL   MCH 28.8 26.0 - 34.0 pg   MCHC 33.0 30.0 - 36.0 g/dL   RDW 40.9 81.1 - 91.4 %   Platelets 289 150 - 400 K/uL   nRBC 0.0 0.0 - 0.2 %   MAU Course  Procedures Fioricet  MDM Labs ordered and reviewed. Normal neuro exam. Declined Fioricet, rates pain 4/10 now. UA with many bacteria and WBCs, denies urinary sx, will send UC. No evidence of PEC. Stable for discharge home.  Assessment and Plan   1. [redacted] weeks gestation of pregnancy   2. Acute right flank pain   3. NST (non-stress test) reactive   4. Pregnancy headache in third trimester    Discharge home Follow up at [facility] as scheduled PEC precautions  Allergies as of 06/10/2018   No Known Allergies     Medication List    STOP  taking these medications   cephALEXin 500 MG capsule Commonly known as:  KEFLEX    tamsulosin 0.4 MG Caps capsule Commonly known as:  FLOMAX     TAKE these medications   calcium carbonate 500 MG chewable tablet Commonly known as:  TUMS - dosed in mg elemental calcium Chew 1 tablet by mouth daily.   ondansetron 8 MG disintegrating tablet Commonly known as:  ZOFRAN-ODT Take 1 tablet (8 mg total) by mouth every 8 (eight) hours as needed for nausea or vomiting.   prenatal multivitamin Tabs tablet Take 1 tablet by mouth daily at 12 noon.      Donette Larry, CNM 06/10/2018, 6:20 PM

## 2018-06-12 LAB — CULTURE, OB URINE: Culture: 30000 — AB

## 2018-06-12 LAB — OB RESULTS CONSOLE GBS: GBS: NEGATIVE

## 2018-07-06 ENCOUNTER — Inpatient Hospital Stay (HOSPITAL_COMMUNITY)
Admission: AD | Admit: 2018-07-06 | Discharge: 2018-07-06 | Discharge status: Against Medical Advice | Payer: BLUE CROSS/BLUE SHIELD | Source: Ambulatory Visit | Attending: Obstetrics and Gynecology | Admitting: Obstetrics and Gynecology

## 2018-07-06 NOTE — MAU Note (Signed)
Pt left without being seen.

## 2018-07-07 ENCOUNTER — Other Ambulatory Visit: Payer: Self-pay | Admitting: Obstetrics and Gynecology

## 2018-07-07 DIAGNOSIS — M7989 Other specified soft tissue disorders: Secondary | ICD-10-CM

## 2018-07-09 ENCOUNTER — Inpatient Hospital Stay (HOSPITAL_COMMUNITY): Payer: BLUE CROSS/BLUE SHIELD | Admitting: Anesthesiology

## 2018-07-09 ENCOUNTER — Other Ambulatory Visit: Payer: Self-pay

## 2018-07-09 ENCOUNTER — Encounter (HOSPITAL_COMMUNITY): Payer: Self-pay | Admitting: *Deleted

## 2018-07-09 ENCOUNTER — Inpatient Hospital Stay (HOSPITAL_COMMUNITY)
Admission: AD | Admit: 2018-07-09 | Discharge: 2018-07-12 | DRG: 807 | Disposition: A | Discharge status: Home / Self Care | Payer: BLUE CROSS/BLUE SHIELD | Attending: Obstetrics and Gynecology | Admitting: Obstetrics and Gynecology

## 2018-07-09 DIAGNOSIS — O134 Gestational [pregnancy-induced] hypertension without significant proteinuria, complicating childbirth: Secondary | ICD-10-CM | POA: Diagnosis present

## 2018-07-09 DIAGNOSIS — O1493 Unspecified pre-eclampsia, third trimester: Secondary | ICD-10-CM

## 2018-07-09 DIAGNOSIS — O1490 Unspecified pre-eclampsia, unspecified trimester: Secondary | ICD-10-CM | POA: Diagnosis present

## 2018-07-09 DIAGNOSIS — Z3A37 37 weeks gestation of pregnancy: Secondary | ICD-10-CM | POA: Diagnosis not present

## 2018-07-09 DIAGNOSIS — Z87891 Personal history of nicotine dependence: Secondary | ICD-10-CM | POA: Diagnosis not present

## 2018-07-09 LAB — URINALYSIS, ROUTINE W REFLEX MICROSCOPIC
Bilirubin Urine: NEGATIVE
Glucose, UA: NEGATIVE mg/dL
Hgb urine dipstick: NEGATIVE
Ketones, ur: NEGATIVE mg/dL
Nitrite: NEGATIVE
Protein, ur: NEGATIVE mg/dL
Specific Gravity, Urine: 1.019 (ref 1.005–1.030)
pH: 7 (ref 5.0–8.0)

## 2018-07-09 LAB — COMPREHENSIVE METABOLIC PANEL
ALT: 11 U/L (ref 0–44)
AST: 18 U/L (ref 15–41)
Albumin: 3 g/dL — ABNORMAL LOW (ref 3.5–5.0)
Alkaline Phosphatase: 209 U/L — ABNORMAL HIGH (ref 38–126)
Anion gap: 9 (ref 5–15)
BUN: 8 mg/dL (ref 6–20)
CO2: 20 mmol/L — ABNORMAL LOW (ref 22–32)
Calcium: 9.3 mg/dL (ref 8.9–10.3)
Chloride: 106 mmol/L (ref 98–111)
Creatinine, Ser: 0.61 mg/dL (ref 0.44–1.00)
GFR calc Af Amer: >60 mL/min (ref >60)
GFR calc non Af Amer: >60 mL/min (ref >60)
Glucose, Bld: 98 mg/dL (ref 70–99)
Potassium: 3.7 mmol/L (ref 3.5–5.1)
Sodium: 135 mmol/L (ref 135–145)
Total Bilirubin: 0.4 mg/dL (ref 0.3–1.2)
Total Protein: 7.2 g/dL (ref 6.5–8.1)

## 2018-07-09 LAB — PROTEIN / CREATININE RATIO, URINE
Creatinine, Urine: 138 mg/dL
Protein Creatinine Ratio: 0.16 mg/mg{Cre} — ABNORMAL HIGH (ref 0.00–0.15)
Total Protein, Urine: 22 mg/dL

## 2018-07-09 LAB — CBC
HCT: 37.2 % (ref 36.0–46.0)
Hemoglobin: 12.4 g/dL (ref 12.0–15.0)
MCH: 29.6 pg (ref 26.0–34.0)
MCHC: 33.3 g/dL (ref 30.0–36.0)
MCV: 88.8 fL (ref 80.0–100.0)
Platelets: 291 10*3/uL (ref 150–400)
RBC: 4.19 MIL/uL (ref 3.87–5.11)
RDW: 14.8 % (ref 11.5–15.5)
WBC: 15.3 10*3/uL — ABNORMAL HIGH (ref 4.0–10.5)
nRBC: 0 % (ref 0.0–0.2)

## 2018-07-09 LAB — TYPE AND SCREEN
ABO/RH(D): O POS
ANTIBODY SCREEN: NEGATIVE

## 2018-07-09 MED ORDER — TERBUTALINE SULFATE 1 MG/ML IJ SOLN
0.2500 mg | Freq: Once | INTRAMUSCULAR | Status: DC | PRN
  Filled 2018-07-09: qty 1

## 2018-07-09 MED ORDER — OXYCODONE-ACETAMINOPHEN 5-325 MG PO TABS: 1.0000 | ORAL_TABLET | ORAL | Status: DC | PRN

## 2018-07-09 MED ORDER — DIPHENHYDRAMINE HCL 50 MG/ML IJ SOLN: 12.5000 mg | INTRAMUSCULAR | Status: DC | PRN

## 2018-07-09 MED ORDER — ACETAMINOPHEN 325 MG PO TABS
650.0000 mg | ORAL_TABLET | ORAL | Status: DC | PRN
  Administered 2018-07-10: 650 mg via ORAL
  Filled 2018-07-09: qty 2

## 2018-07-09 MED ORDER — PHENYLEPHRINE 40 MCG/ML (10ML) SYRINGE FOR IV PUSH (FOR BLOOD PRESSURE SUPPORT)
80.0000 ug | PREFILLED_SYRINGE | INTRAVENOUS | Status: DC | PRN
  Filled 2018-07-09: qty 5
  Filled 2018-07-09: qty 10

## 2018-07-09 MED ORDER — LACTATED RINGERS IV SOLN: 500.0000 mL | INTRAVENOUS | Status: DC | PRN

## 2018-07-09 MED ORDER — ONDANSETRON HCL 4 MG/2ML IJ SOLN
4.0000 mg | Freq: Four times a day (QID) | INTRAMUSCULAR | Status: DC | PRN
  Administered 2018-07-10: 4 mg via INTRAVENOUS
  Filled 2018-07-09: qty 2

## 2018-07-09 MED ORDER — PHENYLEPHRINE 40 MCG/ML (10ML) SYRINGE FOR IV PUSH (FOR BLOOD PRESSURE SUPPORT)
80.0000 ug | PREFILLED_SYRINGE | INTRAVENOUS | Status: DC | PRN
  Filled 2018-07-09: qty 10
  Filled 2018-07-09: qty 5
  Filled 2018-07-09: qty 10

## 2018-07-09 MED ORDER — OXYTOCIN 40 UNITS IN LACTATED RINGERS INFUSION - SIMPLE MED: 2.5000 [IU]/h | INTRAVENOUS | Status: DC

## 2018-07-09 MED ORDER — LACTATED RINGERS IV SOLN
INTRAVENOUS | Status: DC
  Administered 2018-07-09 – 2018-07-10 (×4): via INTRAVENOUS

## 2018-07-09 MED ORDER — LACTATED RINGERS IV SOLN
500.0000 mL | Freq: Once | INTRAVENOUS | Status: AC
  Administered 2018-07-09: 500 mL via INTRAVENOUS

## 2018-07-09 MED ORDER — FENTANYL 2.5 MCG/ML BUPIVACAINE 1/10 % EPIDURAL INFUSION (WH - ANES)
14.0000 mL/h | INTRAMUSCULAR | Status: DC | PRN
  Administered 2018-07-09 – 2018-07-10 (×2): 14 mL/h via EPIDURAL
  Filled 2018-07-09 (×3): qty 100

## 2018-07-09 MED ORDER — LIDOCAINE HCL (PF) 1 % IJ SOLN
INTRAMUSCULAR | Status: DC | PRN
  Administered 2018-07-09: 8 mL via EPIDURAL
  Administered 2018-07-10: 2 mL
  Administered 2018-07-10: 5 mL

## 2018-07-09 MED ORDER — EPHEDRINE 5 MG/ML INJ
10.0000 mg | INTRAVENOUS | Status: DC | PRN
  Filled 2018-07-09: qty 2

## 2018-07-09 MED ORDER — ACETAMINOPHEN 500 MG PO TABS
1000.0000 mg | ORAL_TABLET | Freq: Once | ORAL | Status: AC
  Administered 2018-07-09: 1000 mg via ORAL
  Filled 2018-07-09: qty 2

## 2018-07-09 MED ORDER — LIDOCAINE HCL (PF) 1 % IJ SOLN
30.0000 mL | INTRAMUSCULAR | Status: DC | PRN
  Filled 2018-07-09: qty 30

## 2018-07-09 MED ORDER — OXYTOCIN BOLUS FROM INFUSION
500.0000 mL | Freq: Once | INTRAVENOUS | Status: AC
  Administered 2018-07-10: 500 mL via INTRAVENOUS

## 2018-07-09 MED ORDER — OXYTOCIN 40 UNITS IN LACTATED RINGERS INFUSION - SIMPLE MED
1.0000 m[IU]/min | INTRAVENOUS | Status: DC
  Administered 2018-07-09: 2 m[IU]/min via INTRAVENOUS
  Filled 2018-07-09: qty 1000

## 2018-07-09 MED ORDER — SOD CITRATE-CITRIC ACID 500-334 MG/5ML PO SOLN: 30.0000 mL | ORAL | Status: DC | PRN

## 2018-07-09 MED ORDER — OXYCODONE-ACETAMINOPHEN 5-325 MG PO TABS: 2.0000 | ORAL_TABLET | ORAL | Status: DC | PRN

## 2018-07-09 NOTE — H&P (Signed)
Kaylee Watson is a 22 year old G 2 P 0 at 1237 w 1 day presents in early labor. She also presented with hypertension. PIH labs WNL.  OB History    Gravida  2   Para      Term      Preterm      AB  1   Living        SAB  1   TAB      Ectopic      Multiple      Live Births             Past Medical History:  Diagnosis Date  . Complication of anesthesia   . Medical history non-contributory    Past Surgical History:  Procedure Laterality Date  . WISDOM TOOTH EXTRACTION     Family History: family history includes Diabetes in her father; Healthy in her mother; Hypertension in her father. Social History:  reports that she has quit smoking. She has never used smokeless tobacco. She reports that she drank alcohol. She reports that she does not use drugs.     Maternal Diabetes: No Genetic Screening: Normal Maternal Ultrasounds/Referrals: Normal Fetal Ultrasounds or other Referrals:  None Maternal Substance Abuse:  No Significant Maternal Medications:  None Significant Maternal Lab Results:  None Other Comments:  None  Review of Systems  All other systems reviewed and are negative.  Maternal Medical History:  Reason for admission: Contractions.   Contractions: Frequency: regular.   Perceived severity is moderate.      Dilation: 2 Effacement (%): 90 Station: -2 Exam by:: Dr. Vincente PoliGrewal Blood pressure (!) 148/82, pulse 99, temperature 97.6 F (36.4 C), temperature source Oral, resp. rate 18, height 5\' 5"  (1.651 m), weight 98.5 kg, SpO2 96 %. Maternal Exam:  Abdomen: Fetal presentation: vertex     Fetal Exam Fetal State Assessment: Category I - tracings are normal.     Physical Exam  Nursing note and vitals reviewed. Constitutional: She appears well-developed and well-nourished.  HENT:  Head: Normocephalic and atraumatic.  Eyes: Pupils are equal, round, and reactive to light.  Neck: Normal range of motion.  Cardiovascular: Normal rate and  regular rhythm.  Respiratory: Effort normal.  GI: Soft.    Prenatal labs: ABO, Rh:   Antibody:   Rubella:   RPR:    HBsAg:    HIV:    GBS:     Assessment/Plan: IUP at 37 w 1 day Early labor Gestational hypertension Epidural Pitocin prn Anticipate NSVD  Kaylee Watson L 07/09/2018, 9:09 PM

## 2018-07-09 NOTE — Anesthesia Procedure Notes (Signed)
Epidural Patient location during procedure: OB Start time: 07/09/2018 10:32 PM  Staffing Anesthesiologist: Bethena Midgetddono, Syaire Saber, MD  Preanesthetic Checklist Completed: patient identified, site marked, surgical consent, pre-op evaluation, timeout performed, IV checked, risks and benefits discussed and monitors and equipment checked  Epidural Patient position: sitting Prep: site prepped and draped and DuraPrep Patient monitoring: continuous pulse ox and blood pressure Approach: midline Location: L3-L4 Injection technique: LOR air  Needle:  Needle type: Tuohy  Needle gauge: 17 G Needle length: 9 cm and 9 Needle insertion depth: 6 cm Catheter type: closed end flexible Catheter size: 19 Gauge Catheter at skin depth: 11 cm Test dose: negative  Assessment Events: blood not aspirated, injection not painful, no injection resistance, negative IV test and no paresthesia

## 2018-07-09 NOTE — Anesthesia Preprocedure Evaluation (Signed)
Anesthesia Evaluation  Patient identified by MRN, date of birth, ID band Patient awake    Reviewed: Allergy & Precautions, H&P , NPO status , Patient's Chart, lab work & pertinent test results, reviewed documented beta blocker date and time   Airway Mallampati: II  TM Distance: >3 FB Neck ROM: full    Dental no notable dental hx.    Pulmonary neg pulmonary ROS, former smoker,    Pulmonary exam normal breath sounds clear to auscultation       Cardiovascular negative cardio ROS Normal cardiovascular exam Rhythm:regular Rate:Normal     Neuro/Psych negative neurological ROS  negative psych ROS   GI/Hepatic negative GI ROS, Neg liver ROS,   Endo/Other  negative endocrine ROS  Renal/GU negative Renal ROS  negative genitourinary   Musculoskeletal   Abdominal   Peds  Hematology negative hematology ROS (+)   Anesthesia Other Findings   Reproductive/Obstetrics (+) Pregnancy                             Anesthesia Physical Anesthesia Plan  ASA: II  Anesthesia Plan: Epidural   Post-op Pain Management:    Induction:   PONV Risk Score and Plan:   Airway Management Planned:   Additional Equipment:   Intra-op Plan:   Post-operative Plan:   Informed Consent: I have reviewed the patients History and Physical, chart, labs and discussed the procedure including the risks, benefits and alternatives for the proposed anesthesia with the patient or authorized representative who has indicated his/her understanding and acceptance.   Dental Advisory Given  Plan Discussed with: CRNA and Anesthesiologist  Anesthesia Plan Comments: (Labs checked- platelets confirmed with RN in room. Fetal heart tracing, per RN, reported to be stable enough for sitting procedure. Discussed epidural, and patient consents to the procedure:  included risk of possible headache,backache, failed block, allergic reaction, and  nerve injury. This patient was asked if she had any questions or concerns before the procedure started.)        Anesthesia Quick Evaluation

## 2018-07-09 NOTE — MAU Provider Note (Addendum)
History     CSN: [id number]  Arrival date and time: 07/09/18 1844   First Provider Initiated Contact with Patient 07/09/18 1931      Chief Complaint  Patient presents with  . Hypertension  . Headache   Kaylee Watson is a 22 y.o. G2P0 at 3145w1d who presents to MAU with complaints of HTN, HA, and nausea. She reports "not feeling well all day" and having HA and nausea start occurring this morning. Reports going [facility] to [facility], noted to be 143/87 and 148/93. She reports pregnancy is complicated by "pre hypertension and was to be induced at 39 weeks". She describes HA as a frontal HA, rates pain 6/10- has not taken any medication for HA. She denies vomiting or visual changes. She reports occasional contractions that started this morning. +FM. Denies vaginal bleeding or discharge.    OB History    Gravida  2   Para      Term      Preterm      AB  1   Living        SAB  1   TAB      Ectopic      Multiple      Live Births              Past Medical History:  Diagnosis Date  . Complication of anesthesia   . Medical history non-contributory     Past Surgical History:  Procedure Laterality Date  . WISDOM TOOTH EXTRACTION      Family History  Problem Relation Age of Onset  . Healthy Mother   . Diabetes Father   . Hypertension Father   . ADD / ADHD Neg Hx   . Alcohol abuse Neg Hx   . Arthritis Neg Hx   . Anxiety disorder Neg Hx   . Asthma Neg Hx   . Birth defects Neg Hx   . Cancer Neg Hx   . COPD Neg Hx   . Depression Neg Hx   . Drug abuse Neg Hx   . Early death Neg Hx   . Heart disease Neg Hx   . Hearing loss Neg Hx   . Hyperlipidemia Neg Hx   . Intellectual disability Neg Hx   . Kidney disease Neg Hx   . Learning disabilities Neg Hx   . Miscarriages / Stillbirths Neg Hx   . Obesity Neg Hx   . Stroke Neg Hx   . Varicose Veins Neg Hx   . Vision loss Neg Hx     Social History   Tobacco Use  . Smoking status: Former Games developermoker  .  Smokeless tobacco: Never Used  Substance Use Topics  . Alcohol use: Not Currently  . Drug use: Never    Allergies: No Known Allergies  Medications Prior to Admission  Medication Sig Dispense Refill Last Dose  . Prenatal Vit-Fe Fumarate-FA (PRENATAL MULTIVITAMIN) TABS tablet Take 1 tablet by mouth daily at 12 noon.   07/08/2018 at Unknown time  . calcium carbonate (TUMS - DOSED IN MG ELEMENTAL CALCIUM) 500 MG chewable tablet Chew 1 tablet by mouth daily.   More than a month at Unknown time  . ondansetron (ZOFRAN ODT) 8 MG disintegrating tablet Take 1 tablet (8 mg total) by mouth every 8 (eight) hours as needed for nausea or vomiting. 20 tablet 0 More than a month at Unknown time   Results for orders placed or performed during the hospital encounter of 07/09/18 (from  the past 48 hour(s))  Urinalysis, Routine w reflex microscopic     Status: Abnormal   Collection Time: 07/09/18  7:27 PM  Result Value Ref Range   Color, Urine YELLOW YELLOW   APPearance HAZY (A) CLEAR   Specific Gravity, Urine 1.019 1.005 - 1.030   pH 7.0 5.0 - 8.0   Glucose, UA NEGATIVE NEGATIVE mg/dL   Hgb urine dipstick NEGATIVE NEGATIVE   Bilirubin Urine NEGATIVE NEGATIVE   Ketones, ur NEGATIVE NEGATIVE mg/dL   Protein, ur NEGATIVE NEGATIVE mg/dL   Nitrite NEGATIVE NEGATIVE   Leukocytes, UA TRACE (A) NEGATIVE   RBC / HPF 0-5 0 - 5 RBC/hpf   WBC, UA 6-10 0 - 5 WBC/hpf   Bacteria, UA RARE (A) NONE SEEN   Squamous Epithelial / LPF 0-5 0 - 5   Mucus PRESENT     Comment: Performed at [facility], [address] [place], [place] [address]    Review of Systems  Constitutional:       Hypertension  Eyes: Negative for visual disturbance.  Respiratory: Negative.   Cardiovascular: Negative.   Gastrointestinal: Positive for abdominal pain and nausea. Negative for constipation, diarrhea and vomiting.       Contractions  Genitourinary: Negative.   Musculoskeletal: Negative.   Neurological: Positive for headaches.  Negative for dizziness, weakness and light-headedness.   Physical Exam   Today's Vitals   07/09/18 1917 07/09/18 1931 07/09/18 1945 07/09/18 2001  BP: 135/83 134/81 (!) 151/95 (!) 150/103  Pulse: 98 97 (!) 105 (!) 106  Resp:      Temp:      TempSrc:      SpO2:      Weight:      PainSc:       Body mass index is 36.13 kg/m.  Physical Exam  Nursing note and vitals reviewed. Constitutional: She is oriented to person, place, and time. She appears well-developed and well-nourished. No distress.  Cardiovascular: Normal rate, regular rhythm and normal heart sounds.  Respiratory: Effort normal and breath sounds normal. No respiratory distress. She has no wheezes.  GI: Soft.  Gravid appropriate for gestational age  Musculoskeletal: Normal range of motion. She exhibits no edema.  Neurological: She is alert and oriented to person, place, and time. She displays normal reflexes. She exhibits normal muscle tone.  Psychiatric: She has a normal mood and affect. Her behavior is normal. Thought content normal.   Dilation: 1 Effacement (%): 50 Station: -2 Presentation: Vertex Exam by:: Steward Drone CNM   MAU Course  Procedures  MDM Orders Placed This Encounter  Procedures  . Urinalysis, Routine w reflex microscopic  . CBC  . Comprehensive metabolic panel  . Protein / creatinine ratio, urine   PEC labs pending  Treatments in MAU included Tylenol 1000mg  for HA.   Care taken over by Venia Carbon NP @2001  Sharyon Cable 07/09/2018, 8:05 PM   Several elevated BP 150's systolic. Labs pending. Discussed patient with Dr. Vincente Poli, will admit for induction of labor @ [redacted]w[redacted]d  Assessment and Plan   A:  1. Preeclampsia, third trimester     P:  Admit to labor and delivery Admit orders placed Patient and SO made aware of plan, questions answered  , Harolyn Rutherford, NP 07/09/2018 8:35 PM

## 2018-07-09 NOTE — MAU Note (Signed)
Kaylee Watson is a 22 y.o. at 6076w1d here in MAU reporting: +headache +nausea +elevated blood pressure. + swelling in hands and feet. States has not been feeling well today so [facility] Bp there was 143/87 and 148/93. Onset of complaint: all the symptoms she reports started today Pain score: 6/10. Has not taken any medications Vitals:   07/09/18 1854  BP: 139/70  Pulse: 87  Resp: 18  Temp: 97.6 F (36.4 C)  SpO2: 96%     +FM Lab orders placed from triage: ua

## 2018-07-10 ENCOUNTER — Encounter (HOSPITAL_COMMUNITY): Payer: Self-pay | Admitting: General Practice

## 2018-07-10 LAB — CBC
HCT: 36.4 % (ref 36.0–46.0)
HCT: 36.3 % (ref 36.0–46.0)
Hemoglobin: 11.9 g/dL — ABNORMAL LOW (ref 12.0–15.0)
Hemoglobin: 11.9 g/dL — ABNORMAL LOW (ref 12.0–15.0)
MCH: 29.1 pg (ref 26.0–34.0)
MCH: 29.1 pg (ref 26.0–34.0)
MCHC: 32.7 g/dL (ref 30.0–36.0)
MCHC: 32.8 g/dL (ref 30.0–36.0)
MCV: 89 fL (ref 80.0–100.0)
MCV: 88.8 fL (ref 80.0–100.0)
PLATELETS: 261 10*3/uL (ref 150–400)
Platelets: 261 10*3/uL (ref 150–400)
RBC: 4.09 MIL/uL (ref 3.87–5.11)
RBC: 4.09 MIL/uL (ref 3.87–5.11)
RDW: 15 % (ref 11.5–15.5)
RDW: 14.9 % (ref 11.5–15.5)
WBC: 18.2 10*3/uL — ABNORMAL HIGH (ref 4.0–10.5)
WBC: 20.2 10*3/uL — ABNORMAL HIGH (ref 4.0–10.5)
nRBC: 0 % (ref 0.0–0.2)
nRBC: 0 % (ref 0.0–0.2)

## 2018-07-10 LAB — ABO/RH: ABO/RH(D): O POS

## 2018-07-10 LAB — RPR: RPR: NONREACTIVE

## 2018-07-10 MED ORDER — DIPHENHYDRAMINE HCL 25 MG PO CAPS: 25.0000 mg | ORAL_CAPSULE | Freq: Four times a day (QID) | ORAL | Status: DC | PRN

## 2018-07-10 MED ORDER — BENZOCAINE-MENTHOL 20-0.5 % EX AERO
1.0000 "application " | INHALATION_SPRAY | CUTANEOUS | Status: DC | PRN
  Administered 2018-07-10: 1 via TOPICAL
  Filled 2018-07-10: qty 56

## 2018-07-10 MED ORDER — MEASLES, MUMPS & RUBELLA VAC IJ SOLR: 0.5000 mL | Freq: Once | INTRAMUSCULAR | Status: DC

## 2018-07-10 MED ORDER — ACETAMINOPHEN 325 MG PO TABS
650.0000 mg | ORAL_TABLET | ORAL | Status: DC | PRN
  Administered 2018-07-10 – 2018-07-12 (×4): 650 mg via ORAL
  Filled 2018-07-10 (×4): qty 2

## 2018-07-10 MED ORDER — DIBUCAINE 1 % RE OINT: 1.0000 "application " | TOPICAL_OINTMENT | RECTAL | Status: DC | PRN

## 2018-07-10 MED ORDER — ONDANSETRON HCL 4 MG PO TABS: 4.0000 mg | ORAL_TABLET | ORAL | Status: DC | PRN

## 2018-07-10 MED ORDER — TETANUS-DIPHTH-ACELL PERTUSSIS 5-2.5-18.5 LF-MCG/0.5 IM SUSP: 0.5000 mL | Freq: Once | INTRAMUSCULAR | Status: DC

## 2018-07-10 MED ORDER — BISACODYL 10 MG RE SUPP: 10.0000 mg | Freq: Every day | RECTAL | Status: DC | PRN

## 2018-07-10 MED ORDER — ZOLPIDEM TARTRATE 5 MG PO TABS: 5.0000 mg | ORAL_TABLET | Freq: Every evening | ORAL | Status: DC | PRN

## 2018-07-10 MED ORDER — IBUPROFEN 600 MG PO TABS
600.0000 mg | ORAL_TABLET | Freq: Four times a day (QID) | ORAL | Status: DC
  Administered 2018-07-10 – 2018-07-12 (×8): 600 mg via ORAL
  Filled 2018-07-10 (×8): qty 1

## 2018-07-10 MED ORDER — SIMETHICONE 80 MG PO CHEW: 80.0000 mg | CHEWABLE_TABLET | ORAL | Status: DC | PRN

## 2018-07-10 MED ORDER — WITCH HAZEL-GLYCERIN EX PADS: 1.0000 "application " | MEDICATED_PAD | CUTANEOUS | Status: DC | PRN

## 2018-07-10 MED ORDER — COCONUT OIL OIL: 1.0000 "application " | TOPICAL_OIL | Status: DC | PRN

## 2018-07-10 MED ORDER — MEDROXYPROGESTERONE ACETATE 150 MG/ML IM SUSP: 150.0000 mg | INTRAMUSCULAR | Status: DC | PRN

## 2018-07-10 MED ORDER — ONDANSETRON HCL 4 MG/2ML IJ SOLN: 4.0000 mg | INTRAMUSCULAR | Status: DC | PRN

## 2018-07-10 MED ORDER — PRENATAL MULTIVITAMIN CH
1.0000 | ORAL_TABLET | Freq: Every day | ORAL | Status: DC
  Administered 2018-07-11 – 2018-07-12 (×2): 1 via ORAL
  Filled 2018-07-10 (×2): qty 1

## 2018-07-10 MED ORDER — FLEET ENEMA 7-19 GM/118ML RE ENEM: 1.0000 | ENEMA | Freq: Every day | RECTAL | Status: DC | PRN

## 2018-07-10 MED ORDER — SENNOSIDES-DOCUSATE SODIUM 8.6-50 MG PO TABS
2.0000 | ORAL_TABLET | ORAL | Status: DC
  Administered 2018-07-10 – 2018-07-11 (×2): 2 via ORAL
  Filled 2018-07-10 (×2): qty 2

## 2018-07-10 NOTE — Anesthesia Postprocedure Evaluation (Signed)
Anesthesia Post Note  Patient: Kaylee Watson  Procedure(s) Performed: AN AD HOC LABOR EPIDURAL     Patient location during evaluation: Mother Baby Anesthesia Type: Epidural Level of consciousness: awake and alert Pain management: pain level controlled Vital Signs Assessment: post-procedure vital signs reviewed and stable Respiratory status: spontaneous breathing, nonlabored ventilation and respiratory function stable Cardiovascular status: stable Postop Assessment: no headache, no backache and epidural receding Anesthetic complications: no    Last Vitals:  Vitals:   07/10/18 1440 07/10/18 1601  BP: 132/90 124/75  Pulse: 81 90  Resp: 20 18  Temp: 36.4 C 36.4 C  SpO2:      Last Pain:  Vitals:   07/10/18 1601  TempSrc:   PainSc: 0-No pain   Pain Goal: Patients Stated Pain Goal: 0 (07/09/18 1852)               Rica RecordsICKELTON,[name]

## 2018-07-10 NOTE — Anesthesia Procedure Notes (Addendum)
Epidural Patient location during procedure: OB Start time: 07/10/2018 9:10 AM End time: 07/10/2018 9:19 AM  Staffing Anesthesiologist: Marcene DuosFitzgerald, Terel Bann, MD Performed: anesthesiologist   Preanesthetic Checklist Completed: patient identified, site marked, surgical consent, pre-op evaluation, timeout performed, IV checked, risks and benefits discussed and monitors and equipment checked  Epidural Patient position: sitting Prep: site prepped and draped and DuraPrep Patient monitoring: continuous pulse ox and blood pressure Approach: midline Location: L4-L5 Injection technique: LOR air  Needle:  Needle type: Tuohy  Needle gauge: 17 G Needle length: 9 cm and 9 Needle insertion depth: 7 cm Catheter type: closed end flexible Catheter size: 19 Gauge Catheter at skin depth: 12 cm Test dose: negative  Assessment Events: blood not aspirated, injection not painful, no injection resistance, negative IV test and no paresthesia

## 2018-07-10 NOTE — Progress Notes (Signed)
Patient resting  FHR Category 1  Cervix is 90% 6 cm -1 Vertex IUPC placed Follow labor curve

## 2018-07-10 NOTE — Anesthesia Pain Management Evaluation Note (Signed)
  CRNA Pain Management Visit Note  Patient: Kaylee Watson, 22 y.o., female  "Hello I am a [facility]. We have an anesthesia team available at all times to provide care throughout the hospital, including epidural management and anesthesia for C-section. I don't know your plan for the delivery whether it a natural birth, water birth, IV sedation, nitrous supplementation, doula or epidural, but we want to meet your pain goals."   1.Was your pain managed to your expectations on prior hospitalizations?   No prior hospitalizations  2.What is your expectation for pain management during this hospitalization?     Epidural  3.How can we help you reach that goal? Support prn  Record the patient's initial score and the patient's pain goal.   Pain: 4  Pain Goal: 2 [facility] wants you to be able to say your pain was always managed very well.  [facility]WRINKLE,Kaylee Watson 07/10/2018

## 2018-07-11 LAB — CBC
HEMATOCRIT: 34.3 % — AB (ref 36.0–46.0)
HEMOGLOBIN: 11.2 g/dL — AB (ref 12.0–15.0)
MCH: 29.1 pg (ref 26.0–34.0)
MCHC: 32.7 g/dL (ref 30.0–36.0)
MCV: 89.1 fL (ref 80.0–100.0)
NRBC: 0 % (ref 0.0–0.2)
Platelets: 248 10*3/uL (ref 150–400)
RBC: 3.85 MIL/uL — AB (ref 3.87–5.11)
RDW: 15.1 % (ref 11.5–15.5)
WBC: 18.9 10*3/uL — AB (ref 4.0–10.5)

## 2018-07-11 NOTE — Lactation Note (Signed)
This note was copied from a baby's chart. Lactation Consultation Note  Patient Name: Kaylee Watson Reason for consult: Initial assessment;Difficult latch Breastfeeding consultation services and support information given and reviewed.  Baby is 22 hours old and latching with a nipple shield due to flat nipples. Latch score 7.  RN noted colostrum in the shield this morning.  Instructed to feed with cues and call for latch assessment with next feed.   Symphony pump set up and initiated while using nipple shield.  Instructed to post pump x 15 minutes every 3 hours.  Maternal Data Has patient been taught Hand Expression?: Yes Does the patient have breastfeeding experience prior to this delivery?: No  Feeding    LATCH Score                   Interventions    Lactation Tools Discussed/Used Pump Review: Setup, frequency, and cleaning;Milk Storage Initiated by:: LM Date initiated:: 07/11/18   Consult Status Consult Status: Follow-up Date: 07/12/18 Follow-up type: In-patient    Huston FoleyMOULDEN, Jenness Stemler S Watson, 11:32 AM

## 2018-07-11 NOTE — Progress Notes (Signed)
Post Partum Day 1 Subjective: no complaints  Objective: Blood pressure 115/60, pulse 78, temperature 98.6 F (37 C), temperature source Oral, resp. rate 16, height 5\' 5"  (1.651 m), weight 98.5 kg, SpO2 99 %, unknown if currently breastfeeding.  Physical Exam:  General: alert Lochia: appropriate Uterine Fundus: firm Incision: healing well DVT Evaluation: No evidence of DVT seen on physical exam.  Recent Labs    07/10/18 1342 07/11/18 0516  HGB 11.9* 11.2*  HCT 36.3 34.3*    Assessment/Plan: Plan for discharge tomorrow   LOS: 2 days   Kaylee Watson 07/11/2018, 7:49 AM

## 2018-07-12 MED ORDER — IBUPROFEN 600 MG PO TABS: 600.0000 mg | ORAL_TABLET | Freq: Four times a day (QID) | ORAL | 0 refills | Status: AC

## 2018-07-12 NOTE — Discharge Summary (Signed)
Obstetric Discharge Summary Reason for Admission: onset of labor Prenatal Procedures: Preeclampsia and ultrasound Intrapartum Procedures: spontaneous vaginal delivery Postpartum Procedures: none Complications-Operative and Postpartum: none Hemoglobin  Date Value Ref Range Status  07/11/2018 11.2 (L) 12.0 - 15.0 g/dL Final   HCT  Date Value Ref Range Status  07/11/2018 34.3 (L) 36.0 - 46.0 % Final    Physical Exam:  General: alert and cooperative Lochia: appropriate Uterine Fundus: firm Incision: n/a DVT Evaluation: No evidence of DVT seen on physical exam.  Discharge Diagnoses: Term Pregnancy-delivered  Discharge Information: Date: 07/12/2018 Activity: pelvic rest Diet: routine Medications: PNV and Ibuprofen Condition: stable Instructions: refer to practice specific booklet Discharge to: home Follow-up Information    [place] Schedule an appointment as soon as possible for a visit in 6 week(s).   Contact information: [address] [place] [address] [phone]           Newborn Data: Live born female  Birth Weight: 8 lb 2.6 oz (3702 g) APGAR: 8, 9  Newborn Delivery   Birth date/time:  07/10/2018 12:34:00 Delivery type:  Vaginal, Spontaneous     Home with mother.  Kaylee CairoGretchen Watson Miner 07/12/2018, 10:33 AM

## 2018-07-12 NOTE — Lactation Note (Signed)
This note was copied from a baby'Watson chart. Lactation Consultation Note  Patient Name: Kaylee Gayla Medicusinsley Strong ONGEX'BToday'Watson Date: 07/12/2018 Reason for consult: Follow-up assessment Mom reports feedings are going well with the nipple shield.  She has only pumped once because her nipples were sore after pumping.  She does have a pump for home use.  Instructed to post pump 4 times per day with a lower suction setting.  Discussed milk coming to volume and the prevention and treatment of engorgement.  Lactation outpatient services and support reviewed and encouraged.  Maternal Data    Feeding Feeding Type: Breast Fed  LATCH Score Latch: Grasps breast easily, tongue down, lips flanged, rhythmical sucking.  Audible Swallowing: A few with stimulation  Type of Nipple: Flat  Comfort (Breast/Nipple): Soft / non-tender  Hold (Positioning): No assistance needed to correctly position infant at breast.  LATCH Score: 8  Interventions    Lactation Tools Discussed/Used     Consult Status Consult Status: Complete Follow-up type: Call as needed    Kaylee Watson, Kaylee Watson 07/12/2018, 9:16 AM

## 2018-07-15 ENCOUNTER — Ambulatory Visit
Admission: RE | Admit: 2018-07-15 | Discharge: 2018-07-15 | Disposition: A | Discharge status: Home / Self Care | Payer: BLUE CROSS/BLUE SHIELD | Source: Ambulatory Visit | Attending: Obstetrics and Gynecology | Admitting: Obstetrics and Gynecology

## 2018-07-15 DIAGNOSIS — M7989 Other specified soft tissue disorders: Secondary | ICD-10-CM

## 2018-07-18 ENCOUNTER — Inpatient Hospital Stay (HOSPITAL_COMMUNITY)
Admission: AD | Admit: 2018-07-18 | Discharge: 2018-07-18 | Disposition: A | Discharge status: Home / Self Care | Payer: BLUE CROSS/BLUE SHIELD | Source: Ambulatory Visit | Attending: Obstetrics & Gynecology | Admitting: Obstetrics & Gynecology

## 2018-07-18 ENCOUNTER — Other Ambulatory Visit: Payer: Self-pay

## 2018-07-18 DIAGNOSIS — Z87891 Personal history of nicotine dependence: Secondary | ICD-10-CM | POA: Insufficient documentation

## 2018-07-18 DIAGNOSIS — O135 Gestational [pregnancy-induced] hypertension without significant proteinuria, complicating the puerperium: Secondary | ICD-10-CM | POA: Diagnosis present

## 2018-07-18 LAB — COMPREHENSIVE METABOLIC PANEL
ALK PHOS: 143 U/L — AB (ref 38–126)
ALT: 21 U/L (ref 0–44)
AST: 21 U/L (ref 15–41)
Albumin: 3.4 g/dL — ABNORMAL LOW (ref 3.5–5.0)
Anion gap: 8 (ref 5–15)
BILIRUBIN TOTAL: 0.6 mg/dL (ref 0.3–1.2)
BUN: 16 mg/dL (ref 6–20)
CALCIUM: 13 mg/dL — AB (ref 8.9–10.3)
CHLORIDE: 105 mmol/L (ref 98–111)
CO2: 26 mmol/L (ref 22–32)
CREATININE: 0.81 mg/dL (ref 0.44–1.00)
GFR calc non Af Amer: >60 mL/min (ref >60)
Glucose, Bld: 131 mg/dL — ABNORMAL HIGH (ref 70–99)
Potassium: 3.7 mmol/L (ref 3.5–5.1)
Sodium: 139 mmol/L (ref 135–145)
TOTAL PROTEIN: 7.1 g/dL (ref 6.5–8.1)

## 2018-07-18 LAB — CBC
HEMATOCRIT: 42.9 % (ref 36.0–46.0)
HEMOGLOBIN: 14.3 g/dL (ref 12.0–15.0)
MCH: 29.7 pg (ref 26.0–34.0)
MCHC: 33.3 g/dL (ref 30.0–36.0)
MCV: 89.2 fL (ref 80.0–100.0)
NRBC: 0 % (ref 0.0–0.2)
Platelets: 447 10*3/uL — ABNORMAL HIGH (ref 150–400)
RBC: 4.81 MIL/uL (ref 3.87–5.11)
RDW: 14.1 % (ref 11.5–15.5)
WBC: 13.1 10*3/uL — ABNORMAL HIGH (ref 4.0–10.5)

## 2018-07-18 LAB — PROTEIN / CREATININE RATIO, URINE
CREATININE, URINE: 272 mg/dL
Protein Creatinine Ratio: 0.08 mg/mg{Cre} (ref 0.00–0.15)
Total Protein, Urine: 21 mg/dL

## 2018-07-18 NOTE — MAU Note (Signed)
ind for BP, vag del 11/17. Wasn't feeling good, has a headache, sent from the office. Denies visual changes or increase in swelling.  Reports +epigastric pain.

## 2018-07-18 NOTE — MAU Provider Note (Signed)
History     CSN: [id number]  Arrival date and time: 07/18/18 1700   First Provider Initiated Contact with Patient 07/18/18 1805      Chief Complaint  Patient presents with  . Headache  . Hypertension   HPI  Ms.  Kaylee Watson is a 22 y.o. year old G42P1011 female at 8 days PP after SVD who presents to MAU reporting she hasn't been "feeling good today." She reports elevated BP by [employer] RN at home visit today; her BP was 150/110. She reports a H/A rated 5-6/10 with no relief after taking ibuprofen. She also complains of RT side rib pain that "feels like a pulled muscle." She had an IOL @ 37.1 wks d/t elevated BPs. She denies any visual changes. She is here today for [facility] evaluation by request of her OB office. She is a patient of [facility].   Past Medical History:  Diagnosis Date  . Complication of anesthesia   . Medical history non-contributory     Past Surgical History:  Procedure Laterality Date  . WISDOM TOOTH EXTRACTION      Family History  Problem Relation Age of Onset  . Healthy Mother   . Diabetes Father   . Hypertension Father   . ADD / ADHD Neg Hx   . Alcohol abuse Neg Hx   . Arthritis Neg Hx   . Anxiety disorder Neg Hx   . Asthma Neg Hx   . Birth defects Neg Hx   . Cancer Neg Hx   . COPD Neg Hx   . Depression Neg Hx   . Drug abuse Neg Hx   . Early death Neg Hx   . Heart disease Neg Hx   . Hearing loss Neg Hx   . Hyperlipidemia Neg Hx   . Intellectual disability Neg Hx   . Kidney disease Neg Hx   . Learning disabilities Neg Hx   . Miscarriages / Stillbirths Neg Hx   . Obesity Neg Hx   . Stroke Neg Hx   . Varicose Veins Neg Hx   . Vision loss Neg Hx     Social History   Tobacco Use  . Smoking status: Former Games developer  . Smokeless tobacco: Never Used  Substance Use Topics  . Alcohol use: Not Currently  . Drug use: Never    Allergies: No Known Allergies  Medications Prior to Admission  Medication Sig Dispense Refill Last Dose  .  ibuprofen (ADVIL,MOTRIN) 600 MG tablet Take 1 tablet (600 mg total) by mouth every 6 (six) hours. 30 tablet 0   . ondansetron (ZOFRAN ODT) 8 MG disintegrating tablet Take 1 tablet (8 mg total) by mouth every 8 (eight) hours as needed for nausea or vomiting. (Patient not taking: Reported on 07/10/2018) 20 tablet 0 Not Taking at Unknown time  . Prenatal Vit-Fe Fumarate-FA (PRENATAL MULTIVITAMIN) TABS tablet Take 1 tablet by mouth daily at 12 noon.   07/08/2018 at Unknown time    Review of Systems  Constitutional: Negative.   HENT: Negative.   Eyes: Negative.   Respiratory: Negative.   Cardiovascular: Negative.   Gastrointestinal: Negative.   Endocrine: Negative.   Genitourinary: Negative.   Musculoskeletal: Negative.   Skin: Negative.   Allergic/Immunologic: Negative.   Neurological: Positive for headaches (5-6/10).  Hematological: Negative.   Psychiatric/Behavioral: Negative.    Physical Exam   Patient Vitals for the past 24 hrs:  BP Temp Temp src Pulse Resp SpO2  07/18/18 2016 112/66 - - 91 - -  07/18/18 2001 111/65 - - 98 - -  07/18/18 1946 (!) 107/59 - - (!) 105 - -  07/18/18 1931 108/61 - - 98 - -  07/18/18 1916 107/63 - - 98 - -  07/18/18 1901 109/66 - - 96 - -  07/18/18 1846 115/74 - - 96 - -  07/18/18 1831 126/76 - - 97 - -  07/18/18 1816 116/74 - - 100 - -  07/18/18 1801 134/89 - - (!) 111 - -  07/18/18 1746 119/79 - - (!) 106 - -  07/18/18 1731 124/79 - - (!) 110 - -  07/18/18 1723 118/88 - - (!) 108 - -  07/18/18 1710 (!) 152/84 98.2 F (36.8 C) Oral (!) 121 17 99 %    currently breastfeeding.  Physical Exam  Nursing note and vitals reviewed. Constitutional: She is oriented to person, place, and time. She appears well-developed and well-nourished.  HENT:  Head: Normocephalic and atraumatic.  Eyes: Pupils are equal, round, and reactive to light.  Neck: Normal range of motion.  Cardiovascular: Normal rate.  Respiratory: Effort normal.  GI: Soft.   Genitourinary:  Genitourinary Comments: Pelvic deferred  Musculoskeletal: Normal range of motion. She exhibits no edema.  Neurological: She is alert and oriented to person, place, and time. She has normal reflexes.  Skin: Skin is warm and dry.  Psychiatric: She has a normal mood and affect. Her behavior is normal. Judgment and thought content normal.    MAU Course  Procedures  MDM CCUA CBC CMP P/C Ratio - catheterized specimen Serial BPs Offered Percocet - declined Tylenol 1000 mg -- "H/A is gone away"  *Consult with Dr. Alysia PennaErvin @ 2115 - notified of patient's complaints, assessments, & lab results, tx plan d/c home, F/U with [facility] later this week - ok to d/c home, agrees with plan -- recommend BP recheck by 07/20/18  Results for orders placed or performed during the hospital encounter of 07/18/18 (from the past 24 hour(s))  CBC     Status: Abnormal   Collection Time: 07/18/18  6:26 PM  Result Value Ref Range   WBC 13.1 (H) 4.0 - 10.5 K/uL   RBC 4.81 3.87 - 5.11 MIL/uL   Hemoglobin 14.3 12.0 - 15.0 g/dL   HCT 29.542.9 28.436.0 - 13.246.0 %   MCV 89.2 80.0 - 100.0 fL   MCH 29.7 26.0 - 34.0 pg   MCHC 33.3 30.0 - 36.0 g/dL   RDW 44.014.1 10.211.5 - 72.515.5 %   Platelets 447 (H) 150 - 400 K/uL   nRBC 0.0 0.0 - 0.2 %  Comprehensive metabolic panel     Status: Abnormal   Collection Time: 07/18/18  6:26 PM  Result Value Ref Range   Sodium 139 135 - 145 mmol/L   Potassium 3.7 3.5 - 5.1 mmol/L   Chloride 105 98 - 111 mmol/L   CO2 26 22 - 32 mmol/L   Glucose, Bld 131 (H) 70 - 99 mg/dL   BUN 16 6 - 20 mg/dL   Creatinine, Ser 3.660.81 0.44 - 1.00 mg/dL   Calcium 44.013.0 (H) 8.9 - 10.3 mg/dL   Total Protein 7.1 6.5 - 8.1 g/dL   Albumin 3.4 (L) 3.5 - 5.0 g/dL   AST 21 15 - 41 U/L   ALT 21 0 - 44 U/L   Alkaline Phosphatase 143 (H) 38 - 126 U/L   Total Bilirubin 0.6 0.3 - 1.2 mg/dL   GFR calc non Af Amer >60 >60 mL/min   GFR calc Af  Amer >60 >60 mL/min   Anion gap 8 5 - 15  Protein / creatinine ratio, urine      Status: None   Collection Time: 07/18/18  6:34 PM  Result Value Ref Range   Creatinine, Urine 272.00 mg/dL   Total Protein, Urine 21 mg/dL   Protein Creatinine Ratio 0.08 0.00 - 0.15 mg/mg[Cre]    Assessment and Plan  Gestational hypertension without significant proteinuria, postpartum - Plan: Discharge patient - Information provided on PP HTN - Advised to return to MAU for H/A that is not relieved with Tylenol 1000 mg - Message left on Heather Calhoun's VM to have patient seen for BP recheck on Wednesday 11//27/19  Raelyn Mora, MSN, CNM 07/18/2018, 6:18 PM

## 2018-07-18 NOTE — Discharge Instructions (Signed)
Postpartum Hypertension °Postpartum hypertension is high blood pressure after pregnancy that remains higher than normal for more than two days after delivery. You may not realize that you have postpartum hypertension if your blood pressure is not being checked regularly. In some cases, postpartum hypertension will go away on its own, usually within a week of delivery. However, for some women, medical treatment is required to prevent serious complications, such as seizures or stroke. °The following things can affect your blood pressure: °· The type of delivery you had. °· Having received IV fluids or other medicines during or after delivery. ° °What are the causes? °Postpartum hypertension may be caused by any of the following or by a combination of any of the following: °· Hypertension that existed before pregnancy (chronic hypertension). °· Gestational hypertension. °· Preeclampsia or eclampsia. °· Receiving a lot of fluid through an IV during or after delivery. °· Medicines. °· HELLP syndrome. °· Hyperthyroidism. °· Stroke. °· Other rare neurological or blood disorders. ° °In some cases, the cause may not be known. °What increases the risk? °Postpartum hypertension can be related to one or more risk factors, such as: °· Chronic hypertension. In some cases, this may not have been diagnosed before pregnancy. °· Obesity. °· Type 2 diabetes. °· Kidney disease. °· Family history of preeclampsia. °· Other medical conditions that cause hormonal imbalances. ° °What are the signs or symptoms? °As with all types of hypertension, postpartum hypertension may not have any symptoms. Depending on how high your blood pressure is, you may experience: °· Headaches. These may be mild, moderate, or severe. They may also be steady, constant, or sudden in onset (thunderclap headache). °· Visual changes. °· Dizziness. °· Shortness of breath. °· Swelling of your hands, feet, lower legs, or face. In some cases, you may have swelling in  more than one of these locations. °· Heart palpitations or a racing heartbeat. °· Difficulty breathing while lying down. °· Decreased urination. ° °Other rare signs and symptoms may include: °· Sweating more than usual. This lasts longer than a few days after delivery. °· Chest pain. °· Sudden dizziness when you get up from sitting or lying down. °· Seizures. °· Nausea or vomiting. °· Abdominal pain. ° °How is this diagnosed? °The diagnosis of postpartum hypertension is made through a combination of physical examination findings and testing of your blood and urine. You may also have additional tests, such as a CT scan or an MRI, to check for other complications of postpartum hypertension. °How is this treated? °When blood pressure is high enough to require treatment, your options may include: °· Medicines to reduce blood pressure (antihypertensives). Tell your health care provider if you are breastfeeding or if you plan to breastfeed. There are many antihypertensive medicines that are safe to take while breastfeeding. °· Stopping medicines that may be causing hypertension. °· Treating medical conditions that are causing hypertension. °· Treating the complications of hypertension, such as seizures, stroke, or kidney problems. ° °Your health care provider will also continue to monitor your blood pressure closely and repeatedly until it is within a safe range for you. °Follow these instructions at home: °· Take medicines only as directed by your health care provider. °· Get regular exercise after your health care provider tells you that it is safe. °· Follow your health care provider’s recommendations on fluid and salt restrictions. °· Do not use any tobacco products, including cigarettes, chewing tobacco, or electronic cigarettes. If you need help quitting, ask your health care provider. °·   Keep all follow-up visits as directed by your health care provider. This is important. °Contact a health care provider  if: °· Your symptoms get worse. °· You have new symptoms, such as: °? Headache. °? Dizziness. °? Visual changes. °Get help right away if: °· You develop a severe or sudden headache. °· You have seizures. °· You develop numbness or weakness on one side of your body. °· You have difficulty thinking, speaking, or swallowing. °· You develop severe abdominal pain. °· You develop difficulty breathing, chest pain, a racing heartbeat, or heart palpitations. °These symptoms may represent a serious problem that is an emergency. Do not wait to see if the symptoms will go away. Get medical help right away. Call your local emergency services (911 in the [place] Do not drive yourself to the hospital. °This information is not intended to replace advice given to you by your health care provider. Make sure you discuss any questions you have with your health care provider. °Document Released: 04/13/2014 Document Revised: 01/13/2016 Document Reviewed: 02/22/2014 °Elsevier Interactive Patient Education © 2018 [employer] ° °

## 2020-02-07 IMAGING — US US RENAL
1 series · 15 of 25 positions shown · non-contrast
Comparison: None.

CLINICAL DATA: Acute right flank pain with hematuria, 24 weeks
pregnant

EXAM:
RENAL / URINARY TRACT ULTRASOUND COMPLETE

[Series 1: us renal · 15 of 40 slices shown]
[im 1/40]
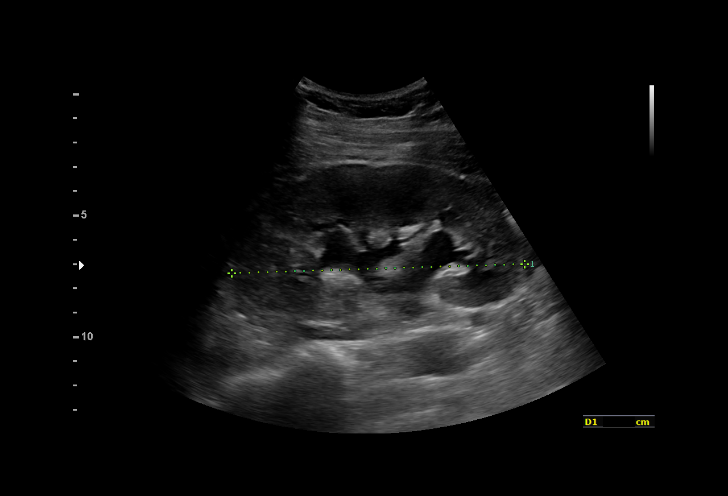
[im 4/40]
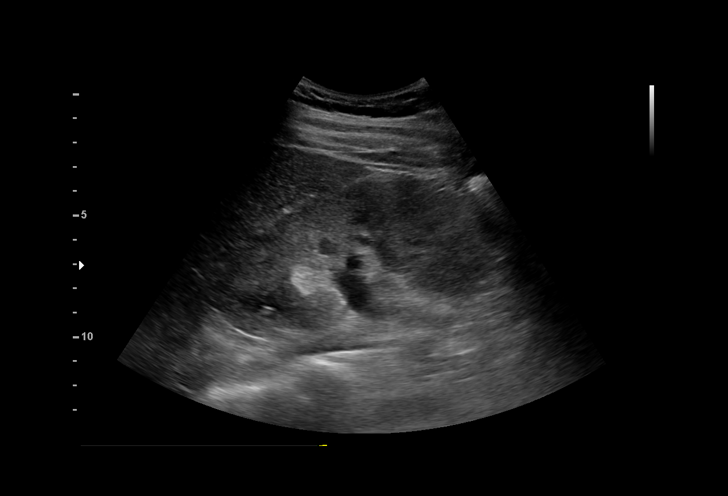
[im 7/40]
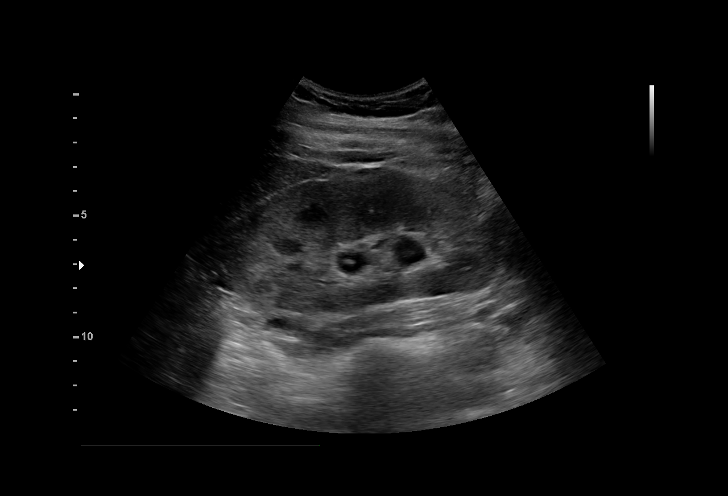
[im 9/40]
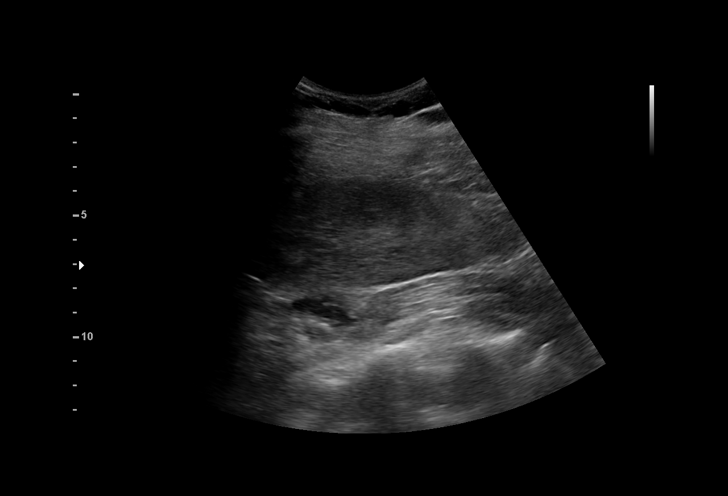
[im 12/40]
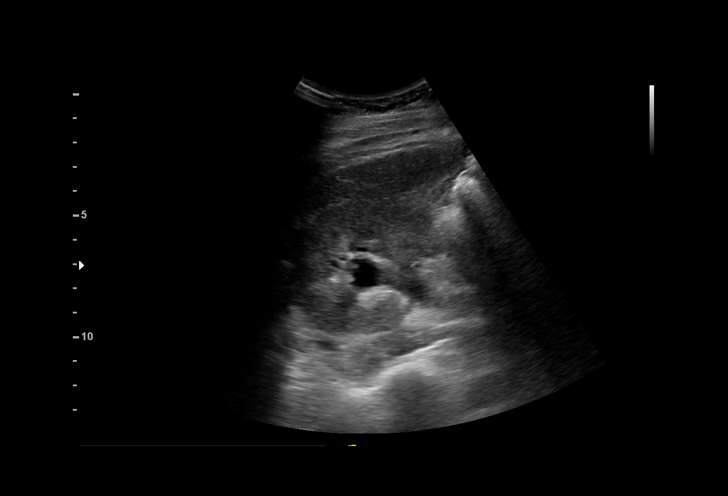
[im 15/40]
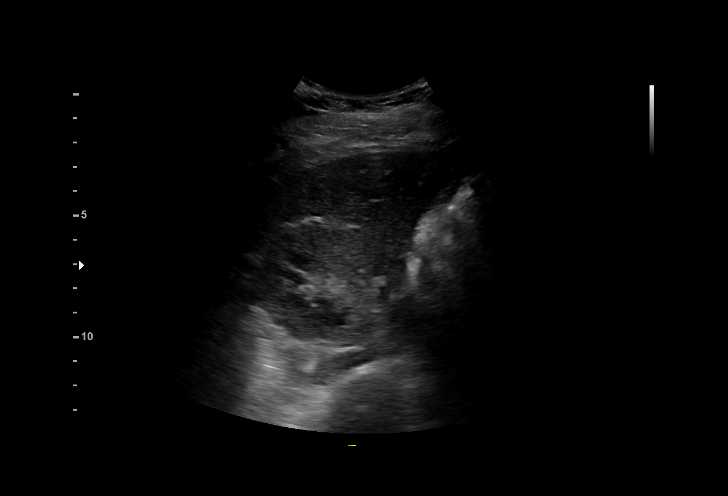
[im 17/40]
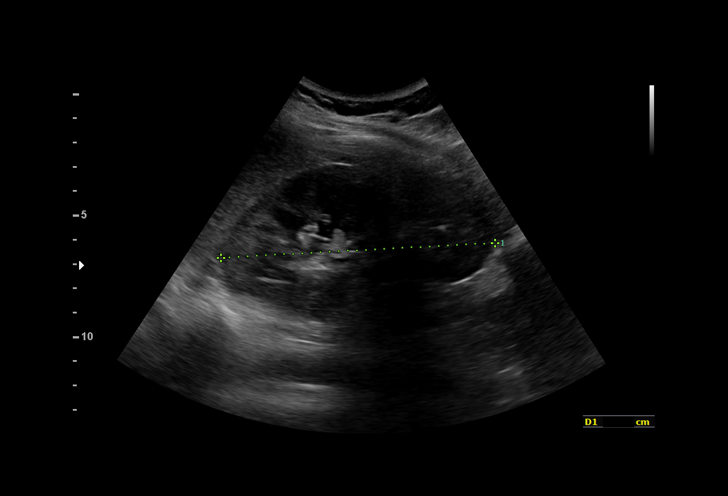
[im 20/40]
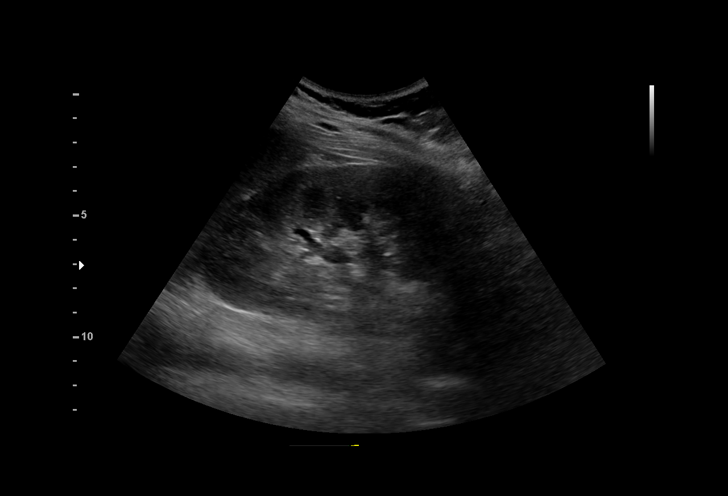
[im 23/40]
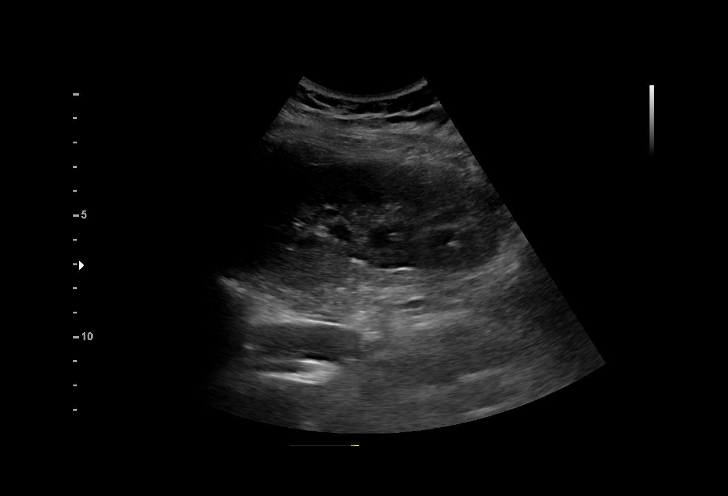
[im 25/40]
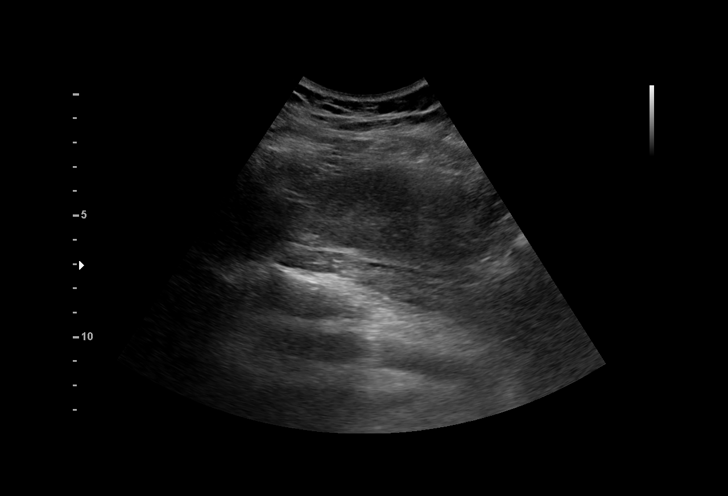
[im 28/40]
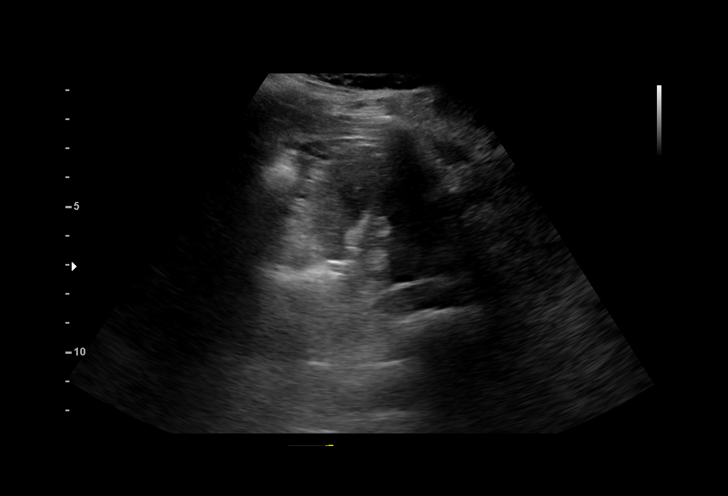
[im 31/40]
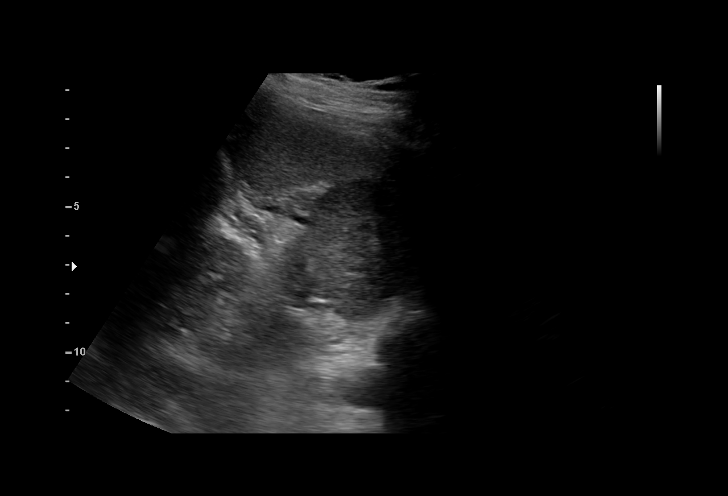
[im 33/40]
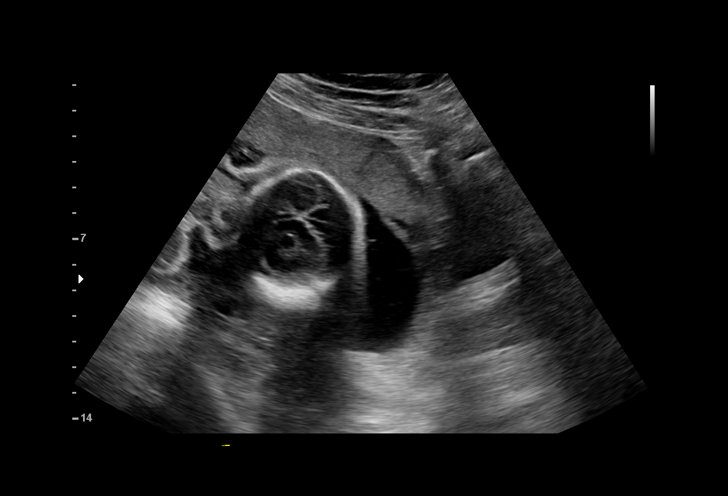
[im 36/40]
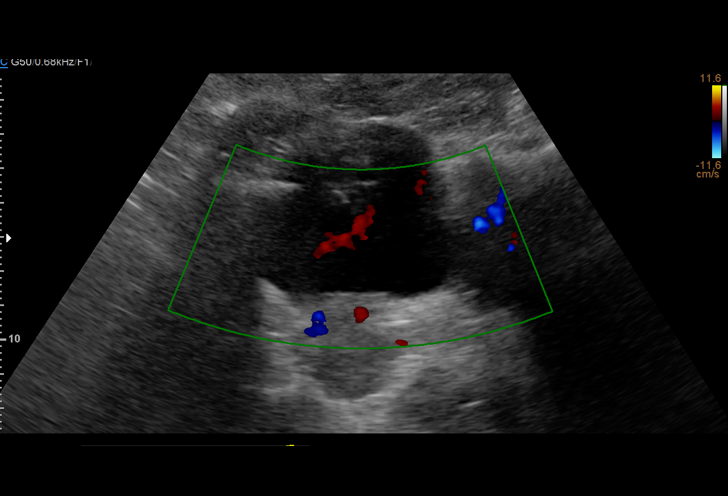
[im 40/40]
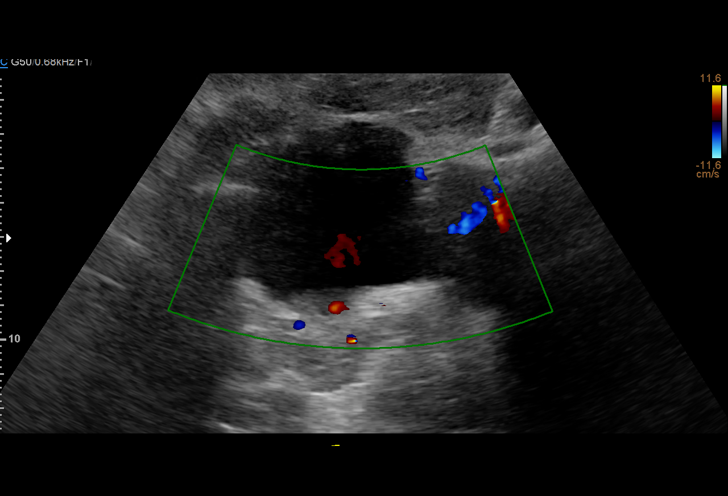

[15 of 25 positions shown; findings below may reference images not displayed]

FINDINGS: Right Kidney:

Length: 12.2 cm.  Mild right hydronephrosis.  No focal abnormality.

Left Kidney:

Length: 11.6 cm. Echogenicity within normal limits. No mass or
hydronephrosis visualized.

Bladder:

Appears normal for degree of bladder distention.
IMPRESSION: Mild right hydronephrosis.
